# Patient Record
Sex: Male | Born: 1945 | Race: Black or African American | Hispanic: No | Marital: Married
Health system: Southern US, Community
[De-identification: ages and names within clinical notes are randomized; demographics above are authoritative.]

---

## 2001-07-19 ENCOUNTER — Encounter: Payer: Self-pay | Admitting: Pulmonary Disease

## 2001-07-19 ENCOUNTER — Ambulatory Visit (HOSPITAL_COMMUNITY): Admission: RE | Admit: 2001-07-19 | Discharge: 2001-07-19 | Payer: Self-pay | Admitting: Pulmonary Disease

## 2001-08-08 ENCOUNTER — Encounter: Admission: RE | Admit: 2001-08-08 | Discharge: 2001-11-06 | Payer: Self-pay | Admitting: Rheumatology

## 2002-04-20 ENCOUNTER — Encounter: Payer: Self-pay | Admitting: Pulmonary Disease

## 2002-12-29 ENCOUNTER — Encounter: Payer: Self-pay | Admitting: Gastroenterology

## 2003-12-26 ENCOUNTER — Encounter: Payer: Self-pay | Admitting: Internal Medicine

## 2004-01-28 ENCOUNTER — Ambulatory Visit: Payer: Self-pay | Admitting: Pulmonary Disease

## 2004-02-14 ENCOUNTER — Ambulatory Visit: Payer: Self-pay | Admitting: Internal Medicine

## 2004-02-22 ENCOUNTER — Ambulatory Visit (HOSPITAL_COMMUNITY): Admission: RE | Admit: 2004-02-22 | Discharge: 2004-02-22 | Payer: Self-pay | Admitting: Internal Medicine

## 2004-02-22 ENCOUNTER — Ambulatory Visit: Payer: Self-pay | Admitting: Internal Medicine

## 2004-04-29 ENCOUNTER — Ambulatory Visit: Payer: Self-pay | Admitting: Internal Medicine

## 2004-04-29 ENCOUNTER — Encounter: Payer: Self-pay | Admitting: Gastroenterology

## 2004-05-23 ENCOUNTER — Ambulatory Visit (HOSPITAL_COMMUNITY): Admission: RE | Admit: 2004-05-23 | Discharge: 2004-05-23 | Payer: Self-pay | Admitting: Internal Medicine

## 2004-05-23 ENCOUNTER — Encounter (INDEPENDENT_AMBULATORY_CARE_PROVIDER_SITE_OTHER): Payer: Self-pay | Admitting: *Deleted

## 2004-06-11 ENCOUNTER — Ambulatory Visit: Payer: Self-pay | Admitting: Internal Medicine

## 2004-09-16 ENCOUNTER — Ambulatory Visit: Payer: Self-pay | Admitting: Internal Medicine

## 2004-09-26 ENCOUNTER — Ambulatory Visit: Payer: Self-pay | Admitting: Internal Medicine

## 2004-12-22 ENCOUNTER — Ambulatory Visit: Payer: Self-pay | Admitting: Internal Medicine

## 2005-02-23 ENCOUNTER — Ambulatory Visit: Payer: Self-pay | Admitting: Internal Medicine

## 2005-07-10 ENCOUNTER — Ambulatory Visit: Payer: Self-pay | Admitting: Internal Medicine

## 2005-07-31 ENCOUNTER — Ambulatory Visit: Payer: Self-pay | Admitting: Pulmonary Disease

## 2005-08-30 ENCOUNTER — Encounter: Payer: Self-pay | Admitting: Pulmonary Disease

## 2005-08-30 ENCOUNTER — Ambulatory Visit (HOSPITAL_BASED_OUTPATIENT_CLINIC_OR_DEPARTMENT_OTHER): Admission: RE | Admit: 2005-08-30 | Discharge: 2005-08-30 | Payer: Self-pay | Admitting: Pulmonary Disease

## 2005-09-16 ENCOUNTER — Ambulatory Visit: Payer: Self-pay | Admitting: Pulmonary Disease

## 2005-10-02 ENCOUNTER — Ambulatory Visit: Payer: Self-pay | Admitting: Internal Medicine

## 2005-10-09 ENCOUNTER — Ambulatory Visit (HOSPITAL_COMMUNITY): Admission: RE | Admit: 2005-10-09 | Discharge: 2005-10-09 | Payer: Self-pay | Admitting: Internal Medicine

## 2005-10-23 ENCOUNTER — Ambulatory Visit: Payer: Self-pay | Admitting: Gastroenterology

## 2005-11-12 ENCOUNTER — Ambulatory Visit: Payer: Self-pay | Admitting: Gastroenterology

## 2005-11-18 ENCOUNTER — Encounter (INDEPENDENT_AMBULATORY_CARE_PROVIDER_SITE_OTHER): Payer: Self-pay | Admitting: Specialist

## 2005-11-18 ENCOUNTER — Ambulatory Visit: Payer: Self-pay | Admitting: Gastroenterology

## 2005-11-23 ENCOUNTER — Ambulatory Visit: Payer: Self-pay | Admitting: Pulmonary Disease

## 2006-01-01 ENCOUNTER — Ambulatory Visit: Payer: Self-pay | Admitting: Gastroenterology

## 2006-01-21 ENCOUNTER — Ambulatory Visit: Payer: Self-pay | Admitting: Internal Medicine

## 2006-03-05 ENCOUNTER — Emergency Department (HOSPITAL_COMMUNITY): Admission: EM | Admit: 2006-03-05 | Discharge: 2006-03-05 | Payer: Self-pay | Admitting: Emergency Medicine

## 2006-03-16 ENCOUNTER — Ambulatory Visit: Payer: Self-pay | Admitting: Internal Medicine

## 2006-05-04 ENCOUNTER — Ambulatory Visit: Payer: Self-pay | Admitting: Endocrinology

## 2006-05-18 ENCOUNTER — Ambulatory Visit: Payer: Self-pay | Admitting: Internal Medicine

## 2006-05-25 ENCOUNTER — Ambulatory Visit: Payer: Self-pay | Admitting: Gastroenterology

## 2006-07-05 ENCOUNTER — Ambulatory Visit: Payer: Self-pay | Admitting: Gastroenterology

## 2006-07-28 ENCOUNTER — Ambulatory Visit: Payer: Self-pay | Admitting: Internal Medicine

## 2006-08-30 ENCOUNTER — Ambulatory Visit: Payer: Self-pay | Admitting: Internal Medicine

## 2006-08-30 LAB — CONVERTED CEMR LAB
ALT: 54 units/L — ABNORMAL HIGH (ref 0–40)
Albumin: 4 g/dL (ref 3.5–5.2)
Alkaline Phosphatase: 40 units/L (ref 39–117)
BUN: 12 mg/dL (ref 6–23)
Basophils Absolute: 0.1 10*3/uL (ref 0.0–0.1)
Basophils Relative: 0.9 % (ref 0.0–1.0)
CO2: 32 meq/L (ref 19–32)
Calcium: 9.2 mg/dL (ref 8.4–10.5)
Eosinophils Absolute: 0.1 10*3/uL (ref 0.0–0.6)
GFR calc Af Amer: 88 mL/min
GFR calc non Af Amer: 73 mL/min
Lymphocytes Relative: 19.8 % (ref 12.0–46.0)
MCHC: 32.7 g/dL (ref 30.0–36.0)
Monocytes Relative: 8.1 % (ref 3.0–11.0)
Neutro Abs: 6.2 10*3/uL (ref 1.4–7.7)
Platelets: 250 10*3/uL (ref 150–400)
TSH: 1.82 microintl units/mL (ref 0.35–5.50)

## 2006-09-17 ENCOUNTER — Ambulatory Visit: Payer: Self-pay

## 2006-09-17 ENCOUNTER — Encounter: Payer: Self-pay | Admitting: Internal Medicine

## 2006-09-21 ENCOUNTER — Ambulatory Visit: Payer: Self-pay | Admitting: Internal Medicine

## 2006-09-25 DIAGNOSIS — J4489 Other specified chronic obstructive pulmonary disease: Secondary | ICD-10-CM | POA: Insufficient documentation

## 2006-09-25 DIAGNOSIS — J449 Chronic obstructive pulmonary disease, unspecified: Secondary | ICD-10-CM

## 2006-09-25 DIAGNOSIS — I10 Essential (primary) hypertension: Secondary | ICD-10-CM | POA: Insufficient documentation

## 2006-09-25 DIAGNOSIS — J309 Allergic rhinitis, unspecified: Secondary | ICD-10-CM | POA: Insufficient documentation

## 2006-10-14 ENCOUNTER — Ambulatory Visit: Payer: Self-pay | Admitting: Cardiology

## 2006-10-14 LAB — CONVERTED CEMR LAB
BUN: 12 mg/dL (ref 6–23)
Basophils Relative: 0.7 % (ref 0.0–1.0)
CO2: 30 meq/L (ref 19–32)
Chloride: 103 meq/L (ref 96–112)
Creatinine, Ser: 0.9 mg/dL (ref 0.4–1.5)
HCT: 40.3 % (ref 39.0–52.0)
Hemoglobin: 13.4 g/dL (ref 13.0–17.0)
INR: 1 (ref 0.9–2.0)
Monocytes Absolute: 0.5 10*3/uL (ref 0.2–0.7)
Neutrophils Relative %: 66.4 % (ref 43.0–77.0)
Potassium: 4 meq/L (ref 3.5–5.1)
Prothrombin Time: 12 s (ref 10.0–14.0)
RBC: 4.95 M/uL (ref 4.22–5.81)
RDW: 14.3 % (ref 11.5–14.6)
Sodium: 139 meq/L (ref 135–145)
aPTT: 31.8 s (ref 26.5–36.5)

## 2006-10-19 ENCOUNTER — Inpatient Hospital Stay (HOSPITAL_BASED_OUTPATIENT_CLINIC_OR_DEPARTMENT_OTHER): Admission: RE | Admit: 2006-10-19 | Discharge: 2006-10-19 | Payer: Self-pay | Admitting: Cardiovascular Disease

## 2006-10-19 ENCOUNTER — Ambulatory Visit: Payer: Self-pay | Admitting: Cardiovascular Disease

## 2006-10-25 ENCOUNTER — Ambulatory Visit: Payer: Self-pay

## 2006-10-28 ENCOUNTER — Ambulatory Visit: Payer: Self-pay | Admitting: Cardiology

## 2006-11-03 ENCOUNTER — Ambulatory Visit: Payer: Self-pay | Admitting: Pulmonary Disease

## 2006-11-16 ENCOUNTER — Ambulatory Visit: Payer: Self-pay | Admitting: Gastroenterology

## 2006-11-30 ENCOUNTER — Encounter: Payer: Self-pay | Admitting: Gastroenterology

## 2007-03-21 ENCOUNTER — Ambulatory Visit: Payer: Self-pay | Admitting: Pulmonary Disease

## 2007-03-21 DIAGNOSIS — J438 Other emphysema: Secondary | ICD-10-CM | POA: Insufficient documentation

## 2007-03-21 DIAGNOSIS — J45909 Unspecified asthma, uncomplicated: Secondary | ICD-10-CM | POA: Insufficient documentation

## 2007-03-22 ENCOUNTER — Ambulatory Visit: Payer: Self-pay | Admitting: Internal Medicine

## 2007-03-22 DIAGNOSIS — J45909 Unspecified asthma, uncomplicated: Secondary | ICD-10-CM | POA: Insufficient documentation

## 2007-03-22 DIAGNOSIS — K219 Gastro-esophageal reflux disease without esophagitis: Secondary | ICD-10-CM | POA: Insufficient documentation

## 2007-03-22 DIAGNOSIS — E785 Hyperlipidemia, unspecified: Secondary | ICD-10-CM

## 2007-03-22 DIAGNOSIS — K279 Peptic ulcer, site unspecified, unspecified as acute or chronic, without hemorrhage or perforation: Secondary | ICD-10-CM | POA: Insufficient documentation

## 2007-03-22 DIAGNOSIS — M545 Low back pain: Secondary | ICD-10-CM

## 2007-03-22 DIAGNOSIS — R51 Headache: Secondary | ICD-10-CM

## 2007-03-22 DIAGNOSIS — R21 Rash and other nonspecific skin eruption: Secondary | ICD-10-CM | POA: Insufficient documentation

## 2007-03-22 DIAGNOSIS — Z860100 Personal history of colon polyps, unspecified: Secondary | ICD-10-CM | POA: Insufficient documentation

## 2007-03-22 DIAGNOSIS — G4733 Obstructive sleep apnea (adult) (pediatric): Secondary | ICD-10-CM

## 2007-03-22 DIAGNOSIS — R519 Headache, unspecified: Secondary | ICD-10-CM | POA: Insufficient documentation

## 2007-03-23 ENCOUNTER — Encounter (INDEPENDENT_AMBULATORY_CARE_PROVIDER_SITE_OTHER): Payer: Self-pay | Admitting: *Deleted

## 2007-03-26 ENCOUNTER — Encounter: Admission: RE | Admit: 2007-03-26 | Discharge: 2007-03-26 | Payer: Self-pay | Admitting: Internal Medicine

## 2007-03-26 ENCOUNTER — Telehealth (INDEPENDENT_AMBULATORY_CARE_PROVIDER_SITE_OTHER): Payer: Self-pay | Admitting: *Deleted

## 2007-03-28 ENCOUNTER — Encounter: Payer: Self-pay | Admitting: Internal Medicine

## 2007-03-28 DIAGNOSIS — Z8679 Personal history of other diseases of the circulatory system: Secondary | ICD-10-CM | POA: Insufficient documentation

## 2007-04-12 ENCOUNTER — Telehealth: Payer: Self-pay | Admitting: Internal Medicine

## 2007-06-03 ENCOUNTER — Telehealth (INDEPENDENT_AMBULATORY_CARE_PROVIDER_SITE_OTHER): Payer: Self-pay | Admitting: *Deleted

## 2007-06-24 ENCOUNTER — Ambulatory Visit: Payer: Self-pay | Admitting: Internal Medicine

## 2007-06-24 DIAGNOSIS — R079 Chest pain, unspecified: Secondary | ICD-10-CM

## 2007-07-04 ENCOUNTER — Encounter: Payer: Self-pay | Admitting: Internal Medicine

## 2007-07-18 ENCOUNTER — Ambulatory Visit: Payer: Self-pay | Admitting: Pulmonary Disease

## 2007-07-26 ENCOUNTER — Ambulatory Visit: Payer: Self-pay | Admitting: Internal Medicine

## 2007-07-26 DIAGNOSIS — M1710 Unilateral primary osteoarthritis, unspecified knee: Secondary | ICD-10-CM

## 2007-07-26 DIAGNOSIS — IMO0002 Reserved for concepts with insufficient information to code with codable children: Secondary | ICD-10-CM | POA: Insufficient documentation

## 2007-09-22 ENCOUNTER — Encounter: Payer: Self-pay | Admitting: Internal Medicine

## 2007-09-27 ENCOUNTER — Ambulatory Visit: Payer: Self-pay | Admitting: Internal Medicine

## 2007-09-27 DIAGNOSIS — R82998 Other abnormal findings in urine: Secondary | ICD-10-CM

## 2007-09-27 DIAGNOSIS — R109 Unspecified abdominal pain: Secondary | ICD-10-CM | POA: Insufficient documentation

## 2007-09-27 LAB — CONVERTED CEMR LAB
ALT: 53 units/L (ref 0–53)
AST: 35 units/L (ref 0–37)
Basophils Absolute: 0.1 10*3/uL (ref 0.0–0.1)
Bilirubin, Direct: 0.2 mg/dL (ref 0.0–0.3)
CO2: 31 meq/L (ref 19–32)
Chloride: 100 meq/L (ref 96–112)
Cholesterol: 168 mg/dL (ref 0–200)
Creatinine, Ser: 1 mg/dL (ref 0.4–1.5)
Direct LDL: 95.9 mg/dL
Eosinophils Absolute: 0.2 10*3/uL (ref 0.0–0.7)
GFR calc non Af Amer: 80 mL/min
HDL: 45.5 mg/dL (ref >39.0)
Hemoglobin, Urine: NEGATIVE
Ketones, ur: NEGATIVE mg/dL
Leukocytes, UA: NEGATIVE
Lymphocytes Relative: 21.6 % (ref 12.0–46.0)
MCHC: 33.8 g/dL (ref 30.0–36.0)
MCV: 80 fL (ref 78.0–100.0)
Neutrophils Relative %: 65.3 % (ref 43.0–77.0)
Platelets: 245 10*3/uL (ref 150–400)
Potassium: 3.6 meq/L (ref 3.5–5.1)
RBC: 5.04 M/uL (ref 4.22–5.81)
RDW: 14.6 % (ref 11.5–14.6)
Sodium: 138 meq/L (ref 135–145)
Specific Gravity, Urine: 1.025 (ref 1.000–1.03)
TSH: 2.01 microintl units/mL (ref 0.35–5.50)
Total Bilirubin: 1.4 mg/dL — ABNORMAL HIGH (ref 0.3–1.2)
Total CHOL/HDL Ratio: 3.7
Urine Glucose: NEGATIVE mg/dL
Urobilinogen, UA: 1 (ref 0.0–1.0)
VLDL: 48 mg/dL — ABNORMAL HIGH (ref 0–40)

## 2007-09-29 ENCOUNTER — Encounter: Admission: RE | Admit: 2007-09-29 | Discharge: 2007-09-29 | Payer: Self-pay | Admitting: Internal Medicine

## 2007-10-27 ENCOUNTER — Ambulatory Visit: Payer: Self-pay | Admitting: Gastroenterology

## 2007-10-27 DIAGNOSIS — B182 Chronic viral hepatitis C: Secondary | ICD-10-CM | POA: Insufficient documentation

## 2007-10-27 DIAGNOSIS — R1033 Periumbilical pain: Secondary | ICD-10-CM | POA: Insufficient documentation

## 2007-11-10 ENCOUNTER — Ambulatory Visit: Payer: Self-pay | Admitting: Internal Medicine

## 2007-11-11 ENCOUNTER — Telehealth: Payer: Self-pay | Admitting: Internal Medicine

## 2007-11-21 ENCOUNTER — Ambulatory Visit: Payer: Self-pay | Admitting: Pulmonary Disease

## 2007-12-05 ENCOUNTER — Ambulatory Visit: Payer: Self-pay | Admitting: Internal Medicine

## 2008-02-16 ENCOUNTER — Encounter: Payer: Self-pay | Admitting: Internal Medicine

## 2008-03-06 ENCOUNTER — Ambulatory Visit: Payer: Self-pay | Admitting: Internal Medicine

## 2008-03-13 ENCOUNTER — Ambulatory Visit: Payer: Self-pay | Admitting: Pulmonary Disease

## 2008-04-11 ENCOUNTER — Ambulatory Visit: Payer: Self-pay | Admitting: Pulmonary Disease

## 2008-05-07 ENCOUNTER — Ambulatory Visit: Payer: Self-pay | Admitting: Internal Medicine

## 2008-05-07 ENCOUNTER — Encounter: Payer: Self-pay | Admitting: Pulmonary Disease

## 2008-06-07 ENCOUNTER — Encounter: Payer: Self-pay | Admitting: Internal Medicine

## 2008-07-26 ENCOUNTER — Telehealth (INDEPENDENT_AMBULATORY_CARE_PROVIDER_SITE_OTHER): Payer: Self-pay | Admitting: *Deleted

## 2008-07-27 ENCOUNTER — Telehealth (INDEPENDENT_AMBULATORY_CARE_PROVIDER_SITE_OTHER): Payer: Self-pay | Admitting: *Deleted

## 2008-08-01 ENCOUNTER — Telehealth (INDEPENDENT_AMBULATORY_CARE_PROVIDER_SITE_OTHER): Payer: Self-pay | Admitting: *Deleted

## 2008-08-02 ENCOUNTER — Telehealth: Payer: Self-pay | Admitting: Internal Medicine

## 2008-08-30 ENCOUNTER — Encounter: Payer: Self-pay | Admitting: Internal Medicine

## 2008-10-05 ENCOUNTER — Ambulatory Visit: Payer: Self-pay | Admitting: Internal Medicine

## 2008-10-05 LAB — CONVERTED CEMR LAB
Basophils Absolute: 0.1 10*3/uL (ref 0.0–0.1)
CO2: 30 meq/L (ref 19–32)
Calcium: 9.2 mg/dL (ref 8.4–10.5)
GFR calc non Af Amer: 78.63 mL/min (ref >60)
HCT: 41.8 % (ref 39.0–52.0)
HDL: 50.3 mg/dL (ref >39.00)
Leukocytes, UA: NEGATIVE
Lymphs Abs: 1.7 10*3/uL (ref 0.7–4.0)
MCV: 80.9 fL (ref 78.0–100.0)
Monocytes Absolute: 0.6 10*3/uL (ref 0.1–1.0)
Neutrophils Relative %: 61.7 % (ref 43.0–77.0)
Nitrite: NEGATIVE
PSA: 0.53 ng/mL (ref 0.10–4.00)
Platelets: 210 10*3/uL (ref 150.0–400.0)
Potassium: 4.1 meq/L (ref 3.5–5.1)
RDW: 13.9 % (ref 11.5–14.6)
Sodium: 140 meq/L (ref 135–145)
Specific Gravity, Urine: 1.025 (ref 1.000–1.030)
TSH: 1.74 microintl units/mL (ref 0.35–5.50)
Total Bilirubin: 1.5 mg/dL — ABNORMAL HIGH (ref 0.3–1.2)
Total CHOL/HDL Ratio: 3
Triglycerides: 63 mg/dL (ref 0.0–149.0)
VLDL: 12.6 mg/dL (ref 0.0–40.0)
pH: 6 (ref 5.0–8.0)

## 2008-10-08 ENCOUNTER — Ambulatory Visit: Payer: Self-pay | Admitting: Pulmonary Disease

## 2008-10-15 ENCOUNTER — Ambulatory Visit: Payer: Self-pay | Admitting: Internal Medicine

## 2008-10-15 DIAGNOSIS — IMO0002 Reserved for concepts with insufficient information to code with codable children: Secondary | ICD-10-CM | POA: Insufficient documentation

## 2008-10-26 ENCOUNTER — Encounter: Admission: RE | Admit: 2008-10-26 | Discharge: 2008-10-26 | Payer: Self-pay | Admitting: Internal Medicine

## 2008-11-13 ENCOUNTER — Telehealth: Payer: Self-pay | Admitting: Internal Medicine

## 2009-01-22 ENCOUNTER — Encounter: Payer: Self-pay | Admitting: Internal Medicine

## 2009-03-12 ENCOUNTER — Ambulatory Visit: Payer: Self-pay | Admitting: Internal Medicine

## 2009-03-12 DIAGNOSIS — R1319 Other dysphagia: Secondary | ICD-10-CM

## 2009-03-20 ENCOUNTER — Encounter (INDEPENDENT_AMBULATORY_CARE_PROVIDER_SITE_OTHER): Payer: Self-pay | Admitting: *Deleted

## 2009-03-20 ENCOUNTER — Ambulatory Visit: Payer: Self-pay | Admitting: Gastroenterology

## 2009-03-27 ENCOUNTER — Ambulatory Visit: Payer: Self-pay | Admitting: Gastroenterology

## 2009-04-05 ENCOUNTER — Telehealth: Payer: Self-pay | Admitting: Internal Medicine

## 2009-04-05 ENCOUNTER — Telehealth: Payer: Self-pay | Admitting: Gastroenterology

## 2009-04-09 ENCOUNTER — Telehealth (INDEPENDENT_AMBULATORY_CARE_PROVIDER_SITE_OTHER): Payer: Self-pay | Admitting: *Deleted

## 2009-05-29 ENCOUNTER — Telehealth: Payer: Self-pay | Admitting: Gastroenterology

## 2009-05-29 ENCOUNTER — Telehealth: Payer: Self-pay | Admitting: Internal Medicine

## 2009-06-27 ENCOUNTER — Ambulatory Visit: Payer: Self-pay | Admitting: Internal Medicine

## 2009-07-02 ENCOUNTER — Telehealth (INDEPENDENT_AMBULATORY_CARE_PROVIDER_SITE_OTHER): Payer: Self-pay

## 2009-07-03 ENCOUNTER — Encounter (HOSPITAL_COMMUNITY): Admission: RE | Admit: 2009-07-03 | Discharge: 2009-09-11 | Payer: Self-pay | Admitting: Internal Medicine

## 2009-07-03 ENCOUNTER — Ambulatory Visit: Payer: Self-pay | Admitting: Cardiovascular Disease

## 2009-07-03 ENCOUNTER — Ambulatory Visit: Payer: Self-pay

## 2009-08-02 ENCOUNTER — Telehealth: Payer: Self-pay | Admitting: Internal Medicine

## 2009-08-28 ENCOUNTER — Encounter: Payer: Self-pay | Admitting: Internal Medicine

## 2009-09-25 ENCOUNTER — Ambulatory Visit: Payer: Self-pay | Admitting: Internal Medicine

## 2009-09-26 LAB — CONVERTED CEMR LAB
ALT: 51 units/L (ref 0–53)
AST: 36 units/L (ref 0–37)
BUN: 20 mg/dL (ref 6–23)
Bilirubin Urine: NEGATIVE
Bilirubin, Direct: 0.2 mg/dL (ref 0.0–0.3)
Eosinophils Relative: 1.7 % (ref 0.0–5.0)
GFR calc non Af Amer: 73.42 mL/min (ref >60)
HCT: 40.8 % (ref 39.0–52.0)
HDL: 47.1 mg/dL (ref >39.00)
Hemoglobin, Urine: NEGATIVE
Hemoglobin: 13.4 g/dL (ref 13.0–17.0)
Leukocytes, UA: NEGATIVE
Lymphs Abs: 1.6 10*3/uL (ref 0.7–4.0)
Monocytes Relative: 9.4 % (ref 3.0–12.0)
Nitrite: NEGATIVE
Platelets: 235 10*3/uL (ref 150.0–400.0)
Potassium: 4 meq/L (ref 3.5–5.1)
Sodium: 141 meq/L (ref 135–145)
TSH: 1.41 microintl units/mL (ref 0.35–5.50)
Total Bilirubin: 1.2 mg/dL (ref 0.3–1.2)
Total CHOL/HDL Ratio: 4
Urobilinogen, UA: 0.2 (ref 0.0–1.0)
VLDL: 29.4 mg/dL (ref 0.0–40.0)
WBC: 8.5 10*3/uL (ref 4.5–10.5)

## 2009-10-16 ENCOUNTER — Encounter (INDEPENDENT_AMBULATORY_CARE_PROVIDER_SITE_OTHER): Payer: Self-pay | Admitting: *Deleted

## 2009-10-16 ENCOUNTER — Ambulatory Visit: Payer: Self-pay | Admitting: Internal Medicine

## 2009-10-16 ENCOUNTER — Encounter: Payer: Self-pay | Admitting: Internal Medicine

## 2010-04-08 NOTE — Assessment & Plan Note (Signed)
Summary: BACK PROBLEM / SWALLOWING PROBLEM/NWS   Vital Signs:  Patient profile:   65 year old male Height:      73 inches Weight:      234 pounds BMI:     30.98 O2 Sat:      99 % on Room air Temp:     97.5 degrees F oral Pulse rate:   94 / minute BP sitting:   118 / 70  (left arm) Cuff size:   regular  Vitals Entered ByZella Ball Ewing (March 12, 2009 1:36 PM)  O2 Flow:  Room air  CC: back pain/swallowing difficulty/RE   Primary Care Provider:  Oliver Barre, MD  CC:  back pain/swallowing difficulty/RE.  History of Present Illness: sees dr Orlan Leavens - due for bilat knee replacements relatively soon as he has come near the end of conservative tx;  today c/o  pain to the lower back and seems different from the knees and not better with increased the gabapentin; o/w for pain still on the tramadol as needed but now working as well as had in the past;  lower bakc pain about the same he states for several years - used to see dr nudelman/NS  - last seen approx 3 yrs ago;  has appt at [facility] soon (first seen approx 2 mo ago) for the lower back - took his [place]  MRI's but [facility] want to repeat the studies;  no change in the blowels or bladder; no fever, wt loss, night swetats, falls, injury or increased other LE pain, weakness or numbness  although he states he is not sure b/c has so much knee pain bilat as well.    also incidently wit acute onset ST, ? low grade temp, minimal prod cough, malaise and general wekaness for 1 wk ';  with some pain with coughing to the mid upper chest;  also has pain to the mid chest sternal area but only with swallowing - occurs daily, n/v , wt loss, blood, or other abd pain.  Overall has good compliance with meds, including the PPI.   Pt denies other CP, sob, doe, wheezing, orthopnea, pnd, worsening LE edema, palps, dizziness or syncope   Pt denies new neuro symptoms such as headache, facial or extremity weakness   Problems Prior to Update: 1)  Other Dysphagia   (ICD-787.29) 2)  Bronchitis-acute  (ICD-466.0) 3)  Lumbar Radiculopathy, Left  (ICD-724.4) 4)  Abdominal Pain-periumbilical  (ICD-789.05) 5)  Hepatitis C-chronic Without Coma  (ICD-070.54) 6)  Other Nonspecific Finding Examination of Urine  (ICD-791.9) 7)  Abdominal Pain, Unspecified Site  (ICD-789.00) 8)  Preventive Health Care  (ICD-V70.0) 9)  Osteoarthritis, Knees, Bilateral  (ICD-715.96) 10)  Chest Pain, Unspecified  (ICD-786.50) 11)  Cerebrovascular Accident, Hx of  (ICD-V12.50) 12)  Peptic Ulcer Disease  (ICD-533.90) 13)  Hyperlipidemia  (ICD-272.4) 14)  Colonic Polyps, Hx of  (ICD-V12.72) 15)  Gerd  (ICD-530.81) 16)  Low Back Pain  (ICD-724.2) 17)  Asthma  (ICD-493.90) 18)  Headache  (ICD-784.0) 19)  Rash-nonvesicular  (ICD-782.1) 20)  Obstructive Sleep Apnea  (ICD-327.23) 21)  Emphysema  (ICD-492.8) 22)  Intrinsic Asthma, Unspecified  (ICD-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 Hold 1 Tablet By Mouth Once A Day Hold 2)  Cialis 20 Mg Tabs (Tadalafil) .... Take 1 Tablet By Mouth Once A Day 3)  B  Complex 100   Tabs (B Complex Vitamins) .... Take 1 Tablet By Mouth Once A Day 4)  Omeprazole 20 Mg Cpdr (Omeprazole) .... 2 By Mouth Once Daily 5)  Flexeril 10 Mg  Tabs (Cyclobenzaprine Hcl) .... Take 1 To 2 Tabs By Mouth At Bedtime As Needed 6)  Ambien 10 Mg  Tabs (Zolpidem Tartrate) .... Take 1 Tab By Mouth At Bedtime 7)  Tramadol Hcl 50 Mg  Tabs (Tramadol Hcl) .Marland Kitchen.. 1 - 2 By Mouth Q 6 Hrs Prn 8)  Clobetasol Propionate 0.05 %  Crea (Clobetasol Propionate) .... Use As Directed 1 Once Daily As Needed 9)  Meloxicam 15 Mg Tabs (Meloxicam) .Marland Kitchen.. 1po Once Daily As Needed 10)  Hyzaar 100-25 Mg Tabs (Losartan Potassium-Hctz) .Marland Kitchen.. 1 By Mouth Once Daily 11)  Proair Hfa 108 (90 Base) Mcg/act  Aers (Albuterol Sulfate) .... 2 Puffs Every 4-6 Hours As  Needed 12)  Gabapentin 300 Mg Caps (Gabapentin) .... 2po Three Times A Day  Current Medications (verified): 1)  Cetirizine Hcl 10 Mg Tabs (Cetirizine Hcl) .... Hold 1 Tablet By Mouth Once A Day Hold 2)  Cialis 20 Mg Tabs (Tadalafil) .... Take 1 Tablet By Mouth Once A Day 3)  B Complex 100   Tabs (B Complex Vitamins) .... Take 1 Tablet By Mouth Once A Day 4)  Omeprazole 20 Mg Cpdr (Omeprazole) .... 2 By Mouth Once Daily 5)  Flexeril 10 Mg  Tabs (Cyclobenzaprine Hcl) .... Take 1 To 2 Tabs By Mouth At Bedtime As Needed 6)  Ambien 10 Mg  Tabs (Zolpidem Tartrate) .... Take 1 Tab By Mouth At Bedtime 7)  Hydrocodone-Acetaminophen 5-325 Mg Tabs (Hydrocodone-Acetaminophen) .Marland Kitchen.. 1po Q 6 Hrs As Needed Pain 8)  Clobetasol Propionate 0.05 %  Crea (Clobetasol Propionate) .... Use As Directed 1 Once Daily As Needed 9)  Meloxicam 15 Mg Tabs (Meloxicam) .Marland Kitchen.. 1po Once Daily As Needed 10)  Hyzaar 100-25 Mg Tabs (Losartan Potassium-Hctz) .Marland Kitchen.. 1 By Mouth Once Daily 11)  Proair Hfa 108 (90 Base) Mcg/act  Aers (Albuterol Sulfate) .... 2 Puffs Every 4-6 Hours As Needed 12)  Gabapentin 300 Mg Caps (Gabapentin) .... 2po Three Times A Day 13)  Azithromycin 250 Mg Tabs (Azithromycin) .... 2po Qd For 1 Day, Then 1po Qd For 4days, Then Stop 14)  Tessalon Perles 100 Mg Caps (Benzonatate) .Marland Kitchen.. 1 - 2 By Mouth Three Times A Day As Needed  Allergies (verified): 1)  ! Darvocet 2)  ! * Latex  Past History:  Past Surgical History: Last updated: 10/27/2007 s/p umbilical hernia repair  Social History: Last updated: 10/27/2007 Former Smoker-stopped 3 years ago Married 1 child disabled Alcohol use-yes-1-2 drinks weekly Illicit Drug Use - no Patient gets regular exercise.  Risk Factors: Alcohol Use: <1 (10/08/2008) Exercise: yes (10/27/2007)  Risk Factors: Smoking Status: quit (10/08/2008) Packs/Day: 1/2 pks (10/08/2008)  Past Medical History: Allergic rhinitis COPD Hypertension OSA -  mild Asthma E.D. normal cors on cath 8/08 right knee djd Low back pain GERD Colonic polyps, hx of - adenoma 9/07 hepatitis C Hyperlipidemia Peptic ulcer disease ? RA - truslow hx of alcohol dependency Sleep Apnea  Review of Systems       all otherwise negative per pt -  Physical Exam  General:  alert and overweight-appearing.  , mild ill  Head:  normocephalic and atraumatic.   Eyes:  vision grossly intact, pupils equal, and pupils round.   Ears:  bilat tm's red, sinus nontender Nose:  nasal dischargemucosal pallor and mucosal erythema.  Mouth:  pharyngeal erythema and fair dentition.   Neck:  supple and no masses.   Lungs:  normal respiratory effort and normal breath sounds.   Heart:  normal rate and regular rhythm.   Abdomen:  soft, non-tender, and normal bowel sounds.   Msk:  no joint tenderness and no joint swelling.  ; spine nontender; bilat knees wtih marked crepitus  Extremities:  no edema, no erythema  Neurologic:  cranial nerves II-XII intact and strength normal in all extremities.     Impression & Recommendations:  Problem # 1:  BRONCHITIS-ACUTE (ICD-466.0)  His updated medication list for this problem includes:    Proair Hfa 108 (90 Base) Mcg/act Aers (Albuterol sulfate) .Marland Kitchen... 2 puffs every 4-6 hours as needed    Azithromycin 250 Mg Tabs (Azithromycin) .Marland Kitchen... 2po qd for 1 day, then 1po qd for 4days, then stop    Tessalon Perles 100 Mg Caps (Benzonatate) .Marland Kitchen... 1 - 2 by mouth three times a day as needed treat as above, f/u any worsening signs or symptoms , check  cxr - r/o pna  Orders: T-2 View CXR, Same Day (71020.5TC)  Problem # 2:  OTHER DYSPHAGIA (ICD-787.29)  with discomfort with swallowing, and more with liquids than solids  - Continue all previous medications as before this visit , refer GI  Orders: Gastroenterology Referral (GI)  Problem # 3:  LUMBAR RADICULOPATHY, LEFT (ICD-724.4)  His updated medication list for this problem includes:     Flexeril 10 Mg Tabs (Cyclobenzaprine hcl) .Marland Kitchen... Take 1 to 2 tabs by mouth at bedtime as needed    Hydrocodone-acetaminophen 5-325 Mg Tabs (Hydrocodone-acetaminophen) .Marland Kitchen... 1po q 6 hrs as needed pain    Meloxicam 15 Mg Tabs (Meloxicam) .Marland Kitchen... 1po once daily as needed and chronic back pain - treat as above, f/u any worsening signs or symptoms, f/u NS at [facility]  Problem # 4:  OSTEOARTHRITIS, KNEES, BILATERAL (ICD-715.96)  His updated medication list for this problem includes:    Flexeril 10 Mg Tabs (Cyclobenzaprine hcl) .Marland Kitchen... Take 1 to 2 tabs by mouth at bedtime as needed    Hydrocodone-acetaminophen 5-325 Mg Tabs (Hydrocodone-acetaminophen) .Marland Kitchen... 1po q 6 hrs as needed pain    Meloxicam 15 Mg Tabs (Meloxicam) .Marland Kitchen... 1po once daily as needed for surgury relatively soon per pt  per ortho - treat as above, f/u any worsening signs or symptoms   Complete Medication List: 1)  Cetirizine Hcl 10 Mg Tabs (Cetirizine hcl) .... Hold 1 tablet by mouth once a day hold 2)  Cialis 20 Mg Tabs (Tadalafil) .... Take 1 tablet by mouth once a day 3)  B Complex 100 Tabs (B complex vitamins) .... Take 1 tablet by mouth once a day 4)  Omeprazole 20 Mg Cpdr (Omeprazole) .... 2 by mouth once daily 5)  Flexeril 10 Mg Tabs (Cyclobenzaprine hcl) .... Take 1 to 2 tabs by mouth at bedtime as needed 6)  Ambien 10 Mg Tabs (Zolpidem tartrate) .... Take 1 tab by mouth at bedtime 7)  Hydrocodone-acetaminophen 5-325 Mg Tabs (Hydrocodone-acetaminophen) .Marland Kitchen.. 1po q 6 hrs as needed pain 8)  Clobetasol Propionate 0.05 % Crea (Clobetasol propionate) .... Use as directed 1 once daily as needed 9)  Meloxicam 15 Mg Tabs (Meloxicam) .Marland Kitchen.. 1po once daily as needed 10)  Hyzaar 100-25 Mg Tabs (Losartan potassium-hctz) .Marland Kitchen.. 1 by mouth once daily 11)  Proair Hfa 108 (90 Base) Mcg/act Aers (Albuterol sulfate) .... 2 puffs every 4-6 hours as needed 12)  Gabapentin 300 Mg Caps (Gabapentin) .Marland KitchenMarland KitchenMarland Kitchen  2po three times a day 13)  Azithromycin 250 Mg Tabs  (Azithromycin) .... 2po qd for 1 day, then 1po qd for 4days, then stop 14)  Tessalon Perles 100 Mg Caps (Benzonatate) .Marland Kitchen.. 1 - 2 by mouth three times a day as needed  Patient Instructions: 1)  stop the tramadol 2)  take the hydrocodone as needed for pain 3)  Please take all new medications as prescribed - the antibx, cough medication 4)  Continue all previous medications as before this visit  5)  please keep your followup appts with Neurosurgury and Orthopedic 6)  You will be contacted about the referral(s) to: GI - Dr Russella Dar 7)  Please schedule a follow-up appointment in 6 months with CPX labs Prescriptions: HYDROCODONE-ACETAMINOPHEN 5-325 MG TABS (HYDROCODONE-ACETAMINOPHEN) 1po q 6 hrs as needed pain  #60 x 2   Entered and Authorized by:   Corwin Levins MD   Signed by:   Corwin Levins MD on 03/12/2009   Method used:   Print then Give to Patient   RxID:   [id number] TESSALON PERLES 100 MG CAPS (BENZONATATE) 1 - 2 by mouth three times a day as needed  #60 x 0   Entered and Authorized by:   Corwin Levins MD   Signed by:   Corwin Levins MD on 03/12/2009   Method used:   Print then Give to Patient   RxID:   [id number] AZITHROMYCIN 250 MG TABS (AZITHROMYCIN) 2po qd for 1 day, then 1po qd for 4days, then stop  #6 x 1   Entered and Authorized by:   Corwin Levins MD   Signed by:   Corwin Levins MD on 03/12/2009   Method used:   Print then Give to Patient   RxID:   [id number]

## 2010-04-08 NOTE — Progress Notes (Signed)
Summary: Request for completion of Disability Form from [employer]  [place] for completion of Disability Form from [facility]. Request forwarded to [employer]. Douglas Garrison  April 09, 2009 3:47 PM

## 2010-04-08 NOTE — Assessment & Plan Note (Signed)
Summary: Cardiology Nuclear Study  Nuclear Med Background Indications for Stress Test: Evaluation for Ischemia  Indications Comments: Recent ED: CP  History: Asthma, COPD, Emphysema, Heart Catheterization, Myocardial Perfusion Study  History Comments: 7/08 MPS: ? very mild inferior ischemia (low risk), EF=58%.>8/08 Cath: Normal Coronaries.  Symptoms: Chest Pain, Palpitations    Nuclear Pre-Procedure Cardiac Risk Factors: CVA, History of Smoking, Hypertension, Lipids, RBBB Caffeine/Decaff Intake: None NPO After: 9:00 PM Lungs: clear IV 0.9% NS with Angio Cath: 20g     IV Site: (R) Wrist IV Started by: Irean Hong RN Chest Size (in) 48     Height (in): 74 Weight (lb): 230 BMI: 29.64  Nuclear Med Study 1 or 2 day study:  1 day     Stress Test Type:  Stress Reading MD:  Charlton Haws, MD     Referring MD:  Oliver Barre Resting Radionuclide:  Technetium 64m Tetrofosmin     Resting Radionuclide Dose:  11 mCi  Stress Radionuclide:  Technetium 40m Tetrofosmin     Stress Radionuclide Dose:  33 mCi   Stress Protocol Exercise Time (min):  7:30 min     Max HR:  144 bpm     Predicted Max HR:  157 bpm  Max Systolic BP: 169 mm Hg     Percent Max HR:  91.72 %     METS: 9.3 Rate Pressure Product:  16109    Stress Test Technologist:  Milana Na EMT-P     Nuclear Technologist:  Domenic Polite CNMT  Rest Procedure  Myocardial perfusion imaging was performed at rest 45 minutes following the intravenous administration of Myoview Technetium 66m Tetrofosmin.  Stress Procedure  The patient exercised for 7:30. The patient stopped due to fatigue and denied any chest pain.  There were no significant ST-T wave changes, occ pvcs and rare pacs.  Myoview was injected at peak exercise and myocardial perfusion imaging was performed after a brief delay.  QPS Raw Data Images:  Normal; no motion artifact; normal heart/lung ratio. Stress Images:  NI: Uniform and normal uptake of tracer in all  myocardial segments. Rest Images:  Normal homogeneous uptake in all areas of the myocardium. Subtraction (SDS):  Normal Transient Ischemic Dilatation:  .94  (Normal <1.22)  Lung/Heart Ratio:  .27  (Normal <0.45)  Quantitative Gated Spect Images QGS EDV:  116 ml QGS ESV:  51 ml QGS EF:  56 % QGS cine images:  normal  Findings Normal nuclear study      Overall Impression  Exercise Capacity: Fair exercise capacity. BP Response: Normal blood pressure response. Clinical Symptoms: No chest pain ECG Impression: RBBB Overall Impression: Diaphragmatic attenuation no ishemia or infarct  Appended Document: Cardiology Nuclear Study LMOPT - labs negative, normal, or stable  - No Acute problem

## 2010-04-08 NOTE — Assessment & Plan Note (Signed)
Summary: DYSPHAGIA--CH.   History of Present Illness Visit Type: consult  Primary GI MD: Elie Goody MD [facility] Primary Provider: Oliver Barre, MD Requesting Provider: Oliver Barre, MD Chief Complaint: Chest pain when pt swallows food History of Present Illness:   This is a 65 year old male with a history of chest pain and difficulty swallowing liquids. He states that when he drinks a large amount of liquids he will notice a pain in his upper chest associated with difficulty swallowing. The symptoms usually subside within 1-2 minutes. He notes no difficulty with solid foods. He has frequent reflux symptoms occurring 3 or 4 times a week despite taking omeprazole 40 mg daily.   GI Review of Systems    Reports acid reflux and  chest pain.      Denies abdominal pain, belching, bloating, dysphagia with liquids, dysphagia with solids, heartburn, loss of appetite, nausea, vomiting, vomiting blood, weight loss, and  weight gain.        Denies anal fissure, black tarry stools, change in bowel habit, constipation, diarrhea, diverticulosis, fecal incontinence, heme positive stool, hemorrhoids, irritable bowel syndrome, jaundice, light color stool, liver problems, rectal bleeding, and  rectal pain.   Current Medications (verified): 1)  Cetirizine Hcl 10 Mg Tabs (Cetirizine Hcl) .... Hold 1 Tablet By Mouth Once A Day Hold 2)  Cialis 20 Mg Tabs (Tadalafil) .... Take 1 Tablet By Mouth Once A Day 3)  B Complex 100   Tabs (B Complex Vitamins) .... Take 1 Tablet By Mouth Once A Day 4)  Omeprazole 20 Mg Cpdr (Omeprazole) .... 2 By Mouth Once Daily 5)  Flexeril 10 Mg  Tabs (Cyclobenzaprine Hcl) .... Take 1 To 2 Tabs By Mouth At Bedtime As Needed 6)  Ambien 10 Mg  Tabs (Zolpidem Tartrate) .... Take 1 Tab By Mouth At Bedtime 7)  Hydrocodone-Acetaminophen 5-325 Mg Tabs (Hydrocodone-Acetaminophen) .Marland Kitchen.. 1po Q 6 Hrs As Needed Pain 8)  Clobetasol Propionate 0.05 %  Crea (Clobetasol Propionate) .... Use As Directed 1  Once Daily As Needed 9)  Meloxicam 15 Mg Tabs (Meloxicam) .Marland Kitchen.. 1po Once Daily As Needed 10)  Hyzaar 100-25 Mg Tabs (Losartan Potassium-Hctz) .Marland Kitchen.. 1 By Mouth Once Daily 11)  Azithromycin 250 Mg Tabs (Azithromycin) .... 2po Qd For 1 Day, Then 1po Qd For 4days, Then Stop 12)  Tessalon Perles 100 Mg Caps (Benzonatate) .Marland Kitchen.. 1 - 2 By Mouth Three Times A Day As Needed  Allergies (verified): 1)  ! Darvocet 2)  ! * Latex  Past History:  Past Medical History: Allergic rhinitis COPD Hypertension OSA - mild Asthma E.D. normal cors on cath 8/08 right knee djd Low back pain GERD Colonic polyps, hx of - adenoma 11/2000 hepatitis C Hyperlipidemia Peptic ulcer disease ? RA - truslow hx of alcohol dependency Sleep Apnea  Past Surgical History: umbilical hernia repair  Family History: Reviewed history from 03/22/2007 and no changes required. adopted daughter with DM  Social History: Reviewed history from 10/27/2007 and no changes required. Former Smoker-stopped 3 years ago Married 1 child disabled Alcohol use-yes-1-2 drinks weekly Illicit Drug Use - no Patient gets regular exercise.  Review of Systems       The pertinent positives and negatives are noted as above and in the HPI. All other ROS were reviewed and were negative.   Vital Signs:  Patient profile:   65 year old male Height:      73 inches Weight:      237 pounds BMI:     31.38 BSA:  2.31 Pulse rate:   94 / minute Pulse rhythm:   regular BP sitting:   122 / 74  (left arm) Cuff size:   regular  Vitals Entered By: Ok Anis CMA (March 20, 2009 1:34 PM)  Physical Exam  General:  Well developed, well nourished, no acute distress. Head:  Normocephalic and atraumatic. Eyes:  PERRLA, no icterus. Ears:  Normal auditory acuity. Mouth:  No deformity or lesions, dentition normal. Neck:  Supple; no masses or thyromegaly. Lungs:  Clear throughout to auscultation. Heart:  Regular rate and rhythm; no murmurs,  rubs,  or bruits. Abdomen:  Soft, nontender and nondistended. No masses, hepatosplenomegaly or hernias noted. Normal bowel sounds. Msk:  Symmetrical with no gross deformities. Normal posture. Pulses:  Normal pulses noted. Extremities:  No clubbing, cyanosis, edema or deformities noted. Neurologic:  Alert and  oriented x4;  grossly normal neurologically. Cervical Nodes:  No significant cervical adenopathy. Inguinal Nodes:  No significant inguinal adenopathy. Psych:  Alert and cooperative. Normal mood and affect.  Impression & Recommendations:  Problem # 1:  OTHER DYSPHAGIA (ICD-787.29) Dysphasia and chest pain with liquids. This only occurs when he drinks large amounts of liquids. Rule out strictures, esophagitis and neoplasms. The risks, benefits and alternatives to endoscopy with possible biopsy and possible dilation were discussed with the patient and they consent to proceed. The procedure will be scheduled electively. Orders: EGD SAV (EGD SAV)  Problem # 2:  GERD (ICD-530.81) Discontinue omeprazole and begin Dexilant 60 mg daily. Intensify all antireflux measures. Orders: EGD SAV (EGD SAV)  Problem # 3:  COLONIC POLYPS, HX OF (ICD-V12.72) History of adenomatous colon polyps, initially diagnosed in September 2002. Surveillance colonoscopy recommended September 2012.  Problem # 4:  HEPATITIS C-CHRONIC WITHOUT COMA (ICD-070.54) Followed medically specialty clinic.  Patient Instructions: 1)  Conscious Sedation brochure given.  2)  Upper Endoscopy with Dilatation brochure given.  3)  Start Dexilant samples one tablet by mouth once daily. 4)  Copy sent to : Oliver Barre, MD 5)  The medication list was reviewed and reconciled.  All changed / newly prescribed medications were explained.  A complete medication list was provided to the patient / caregiver. 6)  Avoid foods high in acid content ( tomatoes, citrus juices, spicy foods) . Avoid eating within 3 to 4 hours of lying down or before  exercising. Do not over eat; try smaller more frequent meals. Elevate head of bed four inches when sleeping.

## 2010-04-08 NOTE — Letter (Signed)
Summary: Initial Eval/Medical Specialty Services  Initial Eval/Medical Specialty Services   Imported By: Sherian Rein 03/21/2009 07:09:02  _____________________________________________________________________  External Attachment:    Type:   Image     Comment:   External Document

## 2010-04-08 NOTE — Progress Notes (Signed)
Summary: throat pain   Phone Note Call from Patient Call back at [phone]   Caller: Patient Call For: Dr. Russella Dar Reason for Call: Talk to Nurse Summary of Call: pt says he had EGD last week and is now experiencing some pain in his throat Initial call taken by: Vallarie Mare,  April 05, 2009 11:20 AM  Follow-up for Phone Call        Patient  devleoped a sore throat earlier this week  "a few days ago".  Pain is in the "neck area".  Denies any further dysphagia.  Patient advised unlikely to be from EGD on 03/27/09.  Patient  is taking omeprazole.  He is advised this maybe a cold symptom.  He is asked to call back or see his primary care if there is no improvement next week.   Follow-up by: Darcey Nora RN, CGRN,  April 05, 2009 11:35 AM  Additional Follow-up for Phone Call Additional follow up Details #1::        Agree. Additional Follow-up by: Meryl Dare MD Clementeen Graham,  April 05, 2009 12:58 PM

## 2010-04-08 NOTE — Assessment & Plan Note (Signed)
Summary: YEARLY FU/ NWS  #   Vital Signs:  Patient profile:   65 year old male Height:      73 inches Weight:      226 pounds BMI:     29.92 O2 Sat:      97 % on Room air Temp:     99.1 degrees F oral Pulse rate:   95 / minute BP sitting:   108 / 64  (left arm) Cuff size:   large  Vitals Entered By: Zella Ball Ewing CMA Duncan Dull) (October 16, 2009 1:17 PM)  O2 Flow:  Room air  CC: yearly followup/RE   Primary Care Maddock Finigan:  Oliver Barre, MD  CC:  yearly followup/RE.  History of Present Illness: here to f/u back pain - worse after lifting boxes with moving last wk;  higher strength pain med helped before that; overall character of the pain seems the same, just more persitsent and severe;   Pt denies CP, worsening sob, doe, wheezing, orthopnea, pnd, worsening LE edema, palps, dizziness or syncope  Pt denies other  new neuro symptoms such as headache, facial or extremity weakness .  No bowel or bladder changes.  NO falls or injury.  No fever, wt loss, night sweats, loss of appetite or other constitutional symptoms  No worsening weakness numbness.   last saw [facility] surgeon approx 2 mo ago - and felt surgury could be deferred as long as pain could be controlled and he functioed as well as he did.   Has also been told he needs bilat knee replacement per rheum but ortho says to hold off for now - not quite bad enough yet.    Problems Prior to Update: 1)  Preventive Health Care  (ICD-V70.0) 2)  Lumbar Radiculopathy, Right  (ICD-724.4) 3)  Chest Pain  (ICD-786.50) 4)  Other Dysphagia  (ICD-787.29) 5)  Lumbar Radiculopathy, Left  (ICD-724.4) 6)  Abdominal Pain-periumbilical  (ICD-789.05) 7)  Hepatitis C-chronic Without Coma  (ICD-070.54) 8)  Other Nonspecific Finding Examination of Urine  (ICD-791.9) 9)  Abdominal Pain, Unspecified Site  (ICD-789.00) 10)  Preventive Health Care  (ICD-V70.0) 11)  Osteoarthritis, Knees, Bilateral  (ICD-715.96) 12)  Chest Pain, Unspecified  (ICD-786.50) 13)   Cerebrovascular Accident, Hx of  (ICD-V12.50) 14)  Peptic Ulcer Disease  (ICD-533.90) 15)  Hyperlipidemia  (ICD-272.4) 16)  Colonic Polyps, Hx of  (ICD-V12.72) 17)  Gerd  (ICD-530.81) 18)  Low Back Pain  (ICD-724.2) 19)  Asthma  (ICD-493.90) 20)  Headache  (ICD-784.0) 21)  Rash-nonvesicular  (ICD-782.1) 22)  Obstructive Sleep Apnea  (ICD-327.23) 23)  Emphysema  (ICD-492.8) 24)  Intrinsic Asthma, Unspecified  (ICD-493.10) 25)  Hypertension  (ICD-401.9) 26)  COPD  (ICD-496) 27)  Allergic Rhinitis  (ICD-477.9)  Medications Prior to Update: 1)  Cetirizine Hcl 10 Mg Tabs (Cetirizine Hcl) .... Hold 1 Tablet By Mouth Once A Day Hold 2)  Cialis 20 Mg Tabs (Tadalafil) .... Take 1 Tablet By Mouth Once A Day 3)  B Complex 100   Tabs (B Complex Vitamins) .... Take 1 Tablet By Mouth Once A Day 4)  Dexilant 60 Mg Cpdr (Dexlansoprazole) .... One Tablet By Mouth Once Daily 5)  Flexeril 10 Mg  Tabs (Cyclobenzaprine Hcl) .... Take 1 To 2 Tabs By Mouth At Bedtime As Needed 6)  Ambien 10 Mg  Tabs (Zolpidem Tartrate) .... Take 1 Tab By Mouth At Bedtime 7)  Oxycodone Hcl 5 Mg Tabs (Oxycodone Hcl) .Marland Kitchen.. 1-2 By Mouth Q 6 Hrs As Needed Pain 8)  Clobetasol Propionate 0.05 %  Crea (Clobetasol Propionate) .... Use As Directed 1 Once Daily As Needed 9)  Meloxicam 15 Mg Tabs (Meloxicam) .Marland Kitchen.. 1po Once Daily As Needed 10)  Hyzaar 100-25 Mg Tabs (Losartan Potassium-Hctz) .Marland Kitchen.. 1 By Mouth Once Daily 11)  Omeprazole 20 Mg Cpdr (Omeprazole) .Marland Kitchen.. 1 By Mouth Two Times A Day 12)  Aspir-Low 81 Mg Tbec (Aspirin) .Marland Kitchen.. 1po Once Daily 13)  Prednisone 5 Mg Tabs (Prednisone) .... 3po Qd For 3days, Then 2po Qd For 3days, Then 1po Qd For 3days, Then Stop  Current Medications (verified): 1)  Cetirizine Hcl 10 Mg Tabs (Cetirizine Hcl) .... Hold 1 Tablet By Mouth Once A Day Hold 2)  Cialis 20 Mg Tabs (Tadalafil) .... Take 1 Tablet By Mouth Once A Day 3)  B Complex 100   Tabs (B Complex Vitamins) .... Take 1 Tablet By Mouth Once A  Day 4)  Dexilant 60 Mg Cpdr (Dexlansoprazole) .... One Tablet By Mouth Once Daily 5)  Flexeril 10 Mg  Tabs (Cyclobenzaprine Hcl) .... Take 1 To 2 Tabs By Mouth At Bedtime As Needed 6)  Ambien 10 Mg  Tabs (Zolpidem Tartrate) .... Take 1 Tab By Mouth At Bedtime 7)  Oxycodone Hcl 5 Mg Tabs (Oxycodone Hcl) .Marland Kitchen.. 1-2 By Mouth Q 6 Hrs As Needed Pain 8)  Clobetasol Propionate 0.05 %  Crea (Clobetasol Propionate) .... Use As Directed 1 Once Daily As Needed 9)  Meloxicam 15 Mg Tabs (Meloxicam) .Marland Kitchen.. 1po Once Daily As Needed 10)  Hyzaar 100-25 Mg Tabs (Losartan Potassium-Hctz) .Marland Kitchen.. 1 By Mouth Once Daily 11)  Omeprazole 20 Mg Cpdr (Omeprazole) .Marland Kitchen.. 1 By Mouth Two Times A Day 12)  Aspir-Low 81 Mg Tbec (Aspirin) .Marland Kitchen.. 1po Once Daily 13)  Prednisone 5 Mg Tabs (Prednisone) .... 3po Qd For 3days, Then 2po Qd For 3days, Then 1po Qd For 3days, Then Stop  Allergies (verified): 1)  ! Darvocet 2)  ! * Latex  Past History:  Past Medical History: Last updated: 03/20/2009 Allergic rhinitis COPD Hypertension OSA - mild Asthma E.D. normal cors on cath 8/08 right knee djd Low back pain GERD Colonic polyps, hx of - adenoma 11/2000 hepatitis C Hyperlipidemia Peptic ulcer disease ? RA - truslow hx of alcohol dependency Sleep Apnea  Past Surgical History: Last updated: 03/20/2009 umbilical hernia repair  Social History: Last updated: 10/27/2007 Former Smoker-stopped 3 years ago Married 1 child disabled Alcohol use-yes-1-2 drinks weekly Illicit Drug Use - no Patient gets regular exercise.  Risk Factors: Alcohol Use: <1 (10/08/2008) Exercise: yes (10/27/2007)  Risk Factors: Smoking Status: quit (10/08/2008) Packs/Day: 1/2 pks (10/08/2008)  Review of Systems       all otherwise negative per pt -    Physical Exam  General:  alert and overweight-appearing.   Head:  normocephalic and atraumatic.   Eyes:  vision grossly intact, pupils equal, and pupils round.   Ears:  R ear normal and L  ear normal.   Nose:  no external deformity and no nasal discharge.   Mouth:  no gingival abnormalities and pharynx pink and moist.   Neck:  supple and no masses.   Lungs:  normal respiratory effort and normal breath sounds.   Heart:  normal rate and regular rhythm.   Abdomen:  soft, non-tender, and normal bowel sounds.   Msk:  no spine or paraspinal tenderness, swelling or erythema;  no SI joint area tenderness as well Extremities:  no edema, no erythema  Neurologic:  cranial nerves II-XII intact, strength normal  in all extremities, sensation intact to light touch, and DTRs symmetrical and normal.  except for bilat hip flexion weakness (4/5)  - appears to be limited due to pain Skin:  color normal and no rashes.   Psych:  not anxious appearing and not depressed appearing.     Impression & Recommendations:  Problem # 1:  LUMBAR RADICULOPATHY, RIGHT (ICD-724.4)  His updated medication list for this problem includes:    Flexeril 10 Mg Tabs (Cyclobenzaprine hcl) .Marland Kitchen... Take 1 to 2 tabs by mouth at bedtime as needed    Oxycodone Hcl 5 Mg Tabs (Oxycodone hcl) .Marland Kitchen... 1-2 by mouth q 6 hrs as needed pain    Meloxicam 15 Mg Tabs (Meloxicam) .Marland Kitchen... 1po once daily as needed    Aspir-low 81 Mg Tbec (Aspirin) .Marland Kitchen... 1po once daily overall stable exam but subjective pain worse with recent lifing;  will need to limit lifting to 20 lbs,  cont pain meds, and repeat prednisone pack, f/u ortho with any worsening  Problem # 2:  HYPERTENSION (ICD-401.9)  His updated medication list for this problem includes:    Hyzaar 100-25 Mg Tabs (Losartan potassium-hctz) .Marland Kitchen... 1 by mouth once daily  BP today: 108/64 Prior BP: 118/72 (09/25/2009)  Labs Reviewed: K+: 4.0 (09/25/2009) Creat: : 1.3 (09/25/2009)   Chol: 168 (09/25/2009)   HDL: 47.10 (09/25/2009)   LDL: 92 (09/25/2009)   TG: 147.0 (09/25/2009) stable overall by hx and exam, ok to continue meds/tx as is   Orders: EKG w/ Interpretation (93000)  Problem #  3:  COPD (ICD-496) stable overall by hx and exam, ok to continue meds/tx as is - does not require meds at this time  Complete Medication List: 1)  Cetirizine Hcl 10 Mg Tabs (Cetirizine hcl) .... Hold 1 tablet by mouth once a day hold 2)  Cialis 20 Mg Tabs (Tadalafil) .... Take 1 tablet by mouth once a day 3)  B Complex 100 Tabs (B complex vitamins) .... Take 1 tablet by mouth once a day 4)  Dexilant 60 Mg Cpdr (Dexlansoprazole) .... One tablet by mouth once daily 5)  Flexeril 10 Mg Tabs (Cyclobenzaprine hcl) .... Take 1 to 2 tabs by mouth at bedtime as needed 6)  Ambien 10 Mg Tabs (Zolpidem tartrate) .... Take 1 tab by mouth at bedtime 7)  Oxycodone Hcl 5 Mg Tabs (Oxycodone hcl) .Marland Kitchen.. 1-2 by mouth q 6 hrs as needed pain 8)  Clobetasol Propionate 0.05 % Crea (Clobetasol propionate) .... Use as directed 1 once daily as needed 9)  Meloxicam 15 Mg Tabs (Meloxicam) .Marland Kitchen.. 1po once daily as needed 10)  Hyzaar 100-25 Mg Tabs (Losartan potassium-hctz) .Marland Kitchen.. 1 by mouth once daily 11)  Omeprazole 20 Mg Cpdr (Omeprazole) .Marland Kitchen.. 1 by mouth two times a day 12)  Aspir-low 81 Mg Tbec (Aspirin) .Marland Kitchen.. 1po once daily 13)  Prednisone 5 Mg Tabs (Prednisone) .... 3po qd for 3days, then 2po qd for 3days, then 1po qd for 3days, then stop  Patient Instructions: 1)  Please take all new medications as prescribed  2)  Continue all previous medications as before this visit  3)  Please schedule a follow-up appointment in 1 year or sooner if needed Prescriptions: FLEXERIL 10 MG  TABS (CYCLOBENZAPRINE HCL) take 1 to 2 tabs by mouth at bedtime as needed  #60 x 5   Entered and Authorized by:   Corwin Levins MD   Signed by:   Corwin Levins MD on 10/16/2009   Method used:  Print then Give to Patient   RxID:   [id number] AMBIEN 10 MG  TABS (ZOLPIDEM TARTRATE) Take 1 tab by mouth at bedtime  #90 x 1   Entered and Authorized by:   Corwin Levins MD   Signed by:   Corwin Levins MD on 10/16/2009   Method used:   Print then Give  to Patient   RxID:   [id number] MELOXICAM 15 MG TABS (MELOXICAM) 1po once daily as needed  #90 x 3   Entered and Authorized by:   Corwin Levins MD   Signed by:   Corwin Levins MD on 10/16/2009   Method used:   Print then Give to Patient   RxID:   [id number] CIALIS 20 MG TABS (TADALAFIL) Take 1 tablet by mouth once a day  #10 x 11   Entered and Authorized by:   Corwin Levins MD   Signed by:   Corwin Levins MD on 10/16/2009   Method used:   Print then Give to Patient   RxID:   [id number] OXYCODONE HCL 5 MG TABS (OXYCODONE HCL) 1-2 by mouth q 6 hrs as needed pain  #120 x 0   Entered and Authorized by:   Corwin Levins MD   Signed by:   Corwin Levins MD on 10/16/2009   Method used:   Print then Give to Patient   RxID:   [id number] PREDNISONE 5 MG TABS (PREDNISONE) 3po qd for 3days, then 2po qd for 3days, then 1po qd for 3days, then stop  #18 x 0   Entered and Authorized by:   Corwin Levins MD   Signed by:   Corwin Levins MD on 10/16/2009   Method used:   Print then Give to Patient   RxID:   [id number]

## 2010-04-08 NOTE — Letter (Signed)
Summary: Neurosurgery/[facility]  Neurosurgery/[facility]   Imported By: Lester [place] 09/26/2009 09:46:22  _____________________________________________________________________  External Attachment:    Type:   Image     Comment:   External Document

## 2010-04-08 NOTE — Progress Notes (Signed)
  Phone Note Refill Request  on Aug 02, 2009 1:36 PM  Refills Requested: Medication #1:  CLOBETASOL PROPIONATE 0.05 %  CREA use as directed 1 once daily as needed   Dosage confirmed as above?Dosage Confirmed   Notes: Public affairs consultant pharmacy Initial call taken by: Scharlene Gloss,  Aug 02, 2009 1:36 PM    Prescriptions: CLOBETASOL PROPIONATE 0.05 %  CREA (CLOBETASOL PROPIONATE) use as directed 1 once daily as needed  #15 Gram x 6   Entered by:   Scharlene Gloss   Authorized by:   Corwin Levins MD   Signed by:   Scharlene Gloss on 08/02/2009   Method used:   Faxed to ...       [facility]       [address]       [place], [place]  [address]       Ph: [phone]       Fax: [phone]   RxID:   [id number]

## 2010-04-08 NOTE — Progress Notes (Signed)
Summary: Medication refill   Phone Note Call from Patient Call back at [phone]   Caller: Patient Call For: Dr. Russella Dar Reason for Call: Refill Medication Summary of Call: Pt needs Omeprazole refilled Initial call taken by: Karna Christmas,  May 29, 2009 4:12 PM  Follow-up for Phone Call        Told pt that Dr. Melvyn Novas office has denied this medication b/c he should be on Dexilant. Pt states the Dexilant was too expensive and needs a refill of omeprazole. I told pt to call Dr. Raphael Gibney office b/c that is who has been filling this medicine or if he wants [place] to take over prescriptions we can. To let us know either way.   Follow-up by: Christie Nottingham CMA Duncan Dull),  May 29, 2009 4:23 PM

## 2010-04-08 NOTE — Assessment & Plan Note (Signed)
Summary: chest issues/lb   Vital Signs:  Patient profile:   65 year old male Height:      73 inches Weight:      232.50 pounds BMI:     30.79 O2 Sat:      98 % on Room air Temp:     98.7 degrees F oral Pulse rate:   84 / minute BP sitting:   124 / 80  (left arm) Cuff size:   large  Vitals Entered ByZella Ball Ewing (June 27, 2009 3:54 PM)  O2 Flow:  Room air  CC: ER Followup/RE   Primary Care Provider:  Oliver Barre, MD  CC:  ER Followup/RE.  History of Present Illness: here after being seen at an ER at [facility] last sat night (5 days ago);  eval there "did not detect anything serious" but asked to f/u here;  pain locaiont was mid to upper sternal area;  dull,. no radaition, no assoc with sob, or nausea, or sweats, palp, orhtopnea, pnd or LE edema, palp's ,dizzness, or syncope.  Had ekgm, cxr , blood tests in the ER - neg per pt.  Tele for several hours neg.  Tx with 5 x 81 mg asa.  Overall lasted 2 to 3hrs,  not treated with pain meds or NTG but seemed to resolve on its own.  Had significant anxiety as well.  Palpation of the chest at the time  - not clear if reporduced the pain.  Pain was nonexertional, may have been mild pleuritic to start, but not later.  No GI cocktail given.  He think now he had some stress on the front of the chest with driving for 6 hrs to get to [place].   2004 EGD with hiatal hernia.  Last stress test approx 5 or more yrs ago - neg.   No pain since then. No feve, ST, or cough/.  Not gotten back to his gym excercise but plans to.   Pt denies other CP, sob, doe, wheezing, orthopnea, pnd, worsening LE edema, palps, dizziness or syncope  Pt denies new neuro symptoms such as headache, facial or extremity weakness   Problems Prior to Update: 1)  Chest Pain  (ICD-786.50) 2)  Other Dysphagia  (ICD-787.29) 3)  Lumbar Radiculopathy, Left  (ICD-724.4) 4)  Abdominal Pain-periumbilical  (ICD-789.05) 5)  Hepatitis C-chronic Without Coma  (ICD-070.54) 6)  Other  Nonspecific Finding Examination of Urine  (ICD-791.9) 7)  Abdominal Pain, Unspecified Site  (ICD-789.00) 8)  Preventive Health Care  (ICD-V70.0) 9)  Osteoarthritis, Knees, Bilateral  (ICD-715.96) 10)  Chest Pain, Unspecified  (ICD-786.50) 11)  Cerebrovascular Accident, Hx of  (ICD-V12.50) 12)  Peptic Ulcer Disease  (ICD-533.90) 13)  Hyperlipidemia  (ICD-272.4) 14)  Colonic Polyps, Hx of  (ICD-V12.72) 15)  Gerd  (ICD-530.81) 16)  Low Back Pain  (ICD-724.2) 17)  Asthma  (ICD-493.90) 18)  Headache  (ICD-784.0) 19)  Rash-nonvesicular  (ICD-782.1) 20)  Obstructive Sleep Apnea  (ICD-327.23) 21)  Emphysema  (ICD-492.8) 22)  Intrinsic Asthma, Unspecified  (ICD-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 Hold 1 Tablet By Mouth Once A Day Hold 2)  Cialis 20 Mg Tabs (Tadalafil) .... Take 1 Tablet By Mouth Once A Day 3)  B Complex 100   Tabs (B Complex Vitamins) .... Take 1 Tablet By Mouth Once A Day 4)  Dexilant 60  Mg Cpdr (Dexlansoprazole) .... One Tablet By Mouth Once Daily 5)  Flexeril 10 Mg  Tabs (Cyclobenzaprine Hcl) .... Take 1 To 2 Tabs By Mouth At Bedtime As Needed 6)  Ambien 10 Mg  Tabs (Zolpidem Tartrate) .... Take 1 Tab By Mouth At Bedtime 7)  Hydrocodone-Acetaminophen 5-325 Mg Tabs (Hydrocodone-Acetaminophen) .Marland Kitchen.. 1po Q 6 Hrs As Needed Pain 8)  Clobetasol Propionate 0.05 %  Crea (Clobetasol Propionate) .... Use As Directed 1 Once Daily As Needed 9)  Meloxicam 15 Mg Tabs (Meloxicam) .Marland Kitchen.. 1po Once Daily As Needed 10)  Hyzaar 100-25 Mg Tabs (Losartan Potassium-Hctz) .Marland Kitchen.. 1 By Mouth Once Daily 11)  Azithromycin 250 Mg Tabs (Azithromycin) .... 2po Qd For 1 Day, Then 1po Qd For 4days, Then Stop 12)  Tessalon Perles 100 Mg Caps (Benzonatate) .Marland Kitchen.. 1 - 2 By Mouth Three Times A Day As Needed 13)  Omeprazole 20 Mg Cpdr (Omeprazole) .Marland Kitchen.. 1 By Mouth Two Times A  Day  Current Medications (verified): 1)  Cetirizine Hcl 10 Mg Tabs (Cetirizine Hcl) .... Hold 1 Tablet By Mouth Once A Day Hold 2)  Cialis 20 Mg Tabs (Tadalafil) .... Take 1 Tablet By Mouth Once A Day 3)  B Complex 100   Tabs (B Complex Vitamins) .... Take 1 Tablet By Mouth Once A Day 4)  Dexilant 60 Mg Cpdr (Dexlansoprazole) .... One Tablet By Mouth Once Daily 5)  Flexeril 10 Mg  Tabs (Cyclobenzaprine Hcl) .... Take 1 To 2 Tabs By Mouth At Bedtime As Needed 6)  Ambien 10 Mg  Tabs (Zolpidem Tartrate) .... Take 1 Tab By Mouth At Bedtime 7)  Hydrocodone-Acetaminophen 5-325 Mg Tabs (Hydrocodone-Acetaminophen) .Marland Kitchen.. 1po Q 6 Hrs As Needed Pain 8)  Clobetasol Propionate 0.05 %  Crea (Clobetasol Propionate) .... Use As Directed 1 Once Daily As Needed 9)  Meloxicam 15 Mg Tabs (Meloxicam) .Marland Kitchen.. 1po Once Daily As Needed 10)  Hyzaar 100-25 Mg Tabs (Losartan Potassium-Hctz) .Marland Kitchen.. 1 By Mouth Once Daily 11)  Omeprazole 20 Mg Cpdr (Omeprazole) .Marland Kitchen.. 1 By Mouth Two Times A Day 12)  Aspir-Low 81 Mg Tbec (Aspirin) .Marland Kitchen.. 1po Once Daily  Allergies (verified): 1)  ! Darvocet 2)  ! * Latex  Past History:  Past Medical History: Last updated: 03/20/2009 Allergic rhinitis COPD Hypertension OSA - mild Asthma E.D. normal cors on cath 8/08 right knee djd Low back pain GERD Colonic polyps, hx of - adenoma 11/2000 hepatitis C Hyperlipidemia Peptic ulcer disease ? RA - truslow hx of alcohol dependency Sleep Apnea  Past Surgical History: Last updated: 03/20/2009 umbilical hernia repair  Social History: Last updated: 10/27/2007 Former Smoker-stopped 3 years ago Married 1 child disabled Alcohol use-yes-1-2 drinks weekly Illicit Drug Use - no Patient gets regular exercise.  Risk Factors: Alcohol Use: <1 (10/08/2008) Exercise: yes (10/27/2007)  Risk Factors: Smoking Status: quit (10/08/2008) Packs/Day: 1/2 pks (10/08/2008)  Review of Systems       all otherwise negative per pt -    Physical  Exam  General:  alert and overweight-appearing.   Head:  normocephalic and atraumatic.   Eyes:  vision grossly intact, pupils equal, and pupils round.   Ears:  R ear normal and L ear normal.   Nose:  no external deformity and no nasal discharge.   Mouth:  no gingival abnormalities and pharynx pink and moist.   Neck:  supple and no masses.   Lungs:  normal respiratory effort and normal breath sounds.   Heart:  normal rate and regular rhythm.   Abdomen:  soft, non-tender, and normal bowel sounds.   Msk:  no joint tenderness and no joint swelling.  , no chest wall tender Extremities:  no edema, no erythema    Impression & Recommendations:  Problem # 1:  CHEST PAIN (ICD-786.50) etiology unclear, ? MSK but cant r/o cardiac given age and risk factors - for ECG and stress test to further evaluate with atypical pain Orders: EKG w/ Interpretation (93000) Cardiolite (Cardiolite)  Problem # 2:  HYPERTENSION (ICD-401.9)  His updated medication list for this problem includes:    Hyzaar 100-25 Mg Tabs (Losartan potassium-hctz) .Marland Kitchen... 1 by mouth once daily  BP today: 124/80 Prior BP: 122/74 (03/20/2009)  Labs Reviewed: K+: 4.1 (10/05/2008) Creat: : 1.2 (10/05/2008)   Chol: 165 (10/05/2008)   HDL: 50.30 (10/05/2008)   LDL: 102 (10/05/2008)   TG: 63.0 (10/05/2008) stable overall by hx and exam, ok to continue meds/tx as is   Problem # 3:  COPD (ICD-496) .stabel - does not appear symtpomatic or need for change in med tx at this time  Complete Medication List: 1)  Cetirizine Hcl 10 Mg Tabs (Cetirizine hcl) .... Hold 1 tablet by mouth once a day hold 2)  Cialis 20 Mg Tabs (Tadalafil) .... Take 1 tablet by mouth once a day 3)  B Complex 100 Tabs (B complex vitamins) .... Take 1 tablet by mouth once a day 4)  Dexilant 60 Mg Cpdr (Dexlansoprazole) .... One tablet by mouth once daily 5)  Flexeril 10 Mg Tabs (Cyclobenzaprine hcl) .... Take 1 to 2 tabs by mouth at bedtime as needed 6)  Ambien 10  Mg Tabs (Zolpidem tartrate) .... Take 1 tab by mouth at bedtime 7)  Hydrocodone-acetaminophen 5-325 Mg Tabs (Hydrocodone-acetaminophen) .Marland Kitchen.. 1po q 6 hrs as needed pain 8)  Clobetasol Propionate 0.05 % Crea (Clobetasol propionate) .... Use as directed 1 once daily as needed 9)  Meloxicam 15 Mg Tabs (Meloxicam) .Marland Kitchen.. 1po once daily as needed 10)  Hyzaar 100-25 Mg Tabs (Losartan potassium-hctz) .Marland Kitchen.. 1 by mouth once daily 11)  Omeprazole 20 Mg Cpdr (Omeprazole) .Marland Kitchen.. 1 by mouth two times a day 12)  Aspir-low 81 Mg Tbec (Aspirin) .Marland Kitchen.. 1po once daily  Patient Instructions: 1)  Your EKG was OK today 2)  Take an Aspirin every day - 81 mg - 1 per day - COATED only 3)  Continue all previous medications as before this visit  4)  You will be contacted about the referral(s) to: Stress test 5)  Please schedule a follow-up appointment in 3 months with CPX labs

## 2010-04-08 NOTE — Progress Notes (Signed)
Summary: RX change  Phone Note Call from Patient   Caller: Patient [phone] Summary of Call: pt called stating that he cannot afford Rx for Dexilant. pt is requesting refills of Omeprazole as an alternate. [facility] [address] Molli Knock to change back and fill? Initial call taken by: Margaret Pyle, CMA,  May 29, 2009 4:35 PM  Follow-up for Phone Call        ok to change to omeprazole 20 mg - 2 per day - to robin to handle Follow-up by: Corwin Levins MD,  May 29, 2009 6:04 PM    New/Updated Medications: OMEPRAZOLE 20 MG CPDR (OMEPRAZOLE) 1 by mouth two times a day Prescriptions: OMEPRAZOLE 20 MG CPDR (OMEPRAZOLE) 1 by mouth two times a day  #60 x 11   Entered by:   Scharlene Gloss   Authorized by:   Corwin Levins MD   Signed by:   Scharlene Gloss on 05/30/2009   Method used:   Faxed to ...       [facility]       [address]       [place], [place]  [address]       Ph: [phone]       Fax: [phone]   RxID:   [id number]

## 2010-04-08 NOTE — Assessment & Plan Note (Signed)
Summary: BACK PAIN  STC   Vital Signs:  Patient profile:   65 year old male Height:      73.5 inches Weight:      231.50 pounds BMI:     30.24 O2 Sat:      98 % on Room air Temp:     97.9 degrees F oral Pulse rate:   97 / minute BP sitting:   118 / 72  (left arm) Cuff size:   large  Vitals Entered By: Zella Ball Ewing CMA Duncan Dull) (September 25, 2009 3:36 PM)  O2 Flow:  Room air  CC: Back Pain/RE   Primary Care Provider:  Oliver Barre, MD  CC:  Back Pain/RE.  History of Present Illness: here after being seen at [facility] with x[facility] lower back; suggested to him that as long as he was not worse with pain and impairment he could put off surgury;  since then in the past 2 wks has had increased pain to the right side, radiation to just past the right knee; no numbness or weakness but can  only stand about 20 min befores sevee increased pain;  no bowel or bladder function, no gait change, fall or other injury.  No fever, recent wt loss, low appetitie.  Tried to take 2 of his hydrocodon 5. 325 but did not work.   Problems Prior to Update: 1)  Preventive Health Care  (ICD-V70.0) 2)  Lumbar Radiculopathy, Right  (ICD-724.4) 3)  Chest Pain  (ICD-786.50) 4)  Other Dysphagia  (ICD-787.29) 5)  Lumbar Radiculopathy, Left  (ICD-724.4) 6)  Abdominal Pain-periumbilical  (ICD-789.05) 7)  Hepatitis C-chronic Without Coma  (ICD-070.54) 8)  Other Nonspecific Finding Examination of Urine  (ICD-791.9) 9)  Abdominal Pain, Unspecified Site  (ICD-789.00) 10)  Preventive Health Care  (ICD-V70.0) 11)  Osteoarthritis, Knees, Bilateral  (ICD-715.96) 12)  Chest Pain, Unspecified  (ICD-786.50) 13)  Cerebrovascular Accident, Hx of  (ICD-V12.50) 14)  Peptic Ulcer Disease  (ICD-533.90) 15)  Hyperlipidemia  (ICD-272.4) 16)  Colonic Polyps, Hx of  (ICD-V12.72) 17)  Gerd  (ICD-530.81) 18)  Low Back Pain  (ICD-724.2) 19)  Asthma  (ICD-493.90) 20)  Headache  (ICD-784.0) 21)  Rash-nonvesicular  (ICD-782.1) 22)   Obstructive Sleep Apnea  (ICD-327.23) 23)  Emphysema  (ICD-492.8) 24)  Intrinsic Asthma, Unspecified  (ICD-493.10) 25)  Hypertension  (ICD-401.9) 26)  COPD  (ICD-496) 27)  Allergic Rhinitis  (ICD-477.9)  Medications Prior to Update: 1)  Cetirizine Hcl 10 Mg Tabs (Cetirizine Hcl) .... Hold 1 Tablet By Mouth Once A Day Hold 2)  Cialis 20 Mg Tabs (Tadalafil) .... Take 1 Tablet By Mouth Once A Day 3)  B Complex 100   Tabs (B Complex Vitamins) .... Take 1 Tablet By Mouth Once A Day 4)  Dexilant 60 Mg Cpdr (Dexlansoprazole) .... One Tablet By Mouth Once Daily 5)  Flexeril 10 Mg  Tabs (Cyclobenzaprine Hcl) .... Take 1 To 2 Tabs By Mouth At Bedtime As Needed 6)  Ambien 10 Mg  Tabs (Zolpidem Tartrate) .... Take 1 Tab By Mouth At Bedtime 7)  Hydrocodone-Acetaminophen 5-325 Mg Tabs (Hydrocodone-Acetaminophen) .Marland Kitchen.. 1po Q 6 Hrs As Needed Pain 8)  Clobetasol Propionate 0.05 %  Crea (Clobetasol Propionate) .... Use As Directed 1 Once Daily As Needed 9)  Meloxicam 15 Mg Tabs (Meloxicam) .Marland Kitchen.. 1po Once Daily As Needed 10)  Hyzaar 100-25 Mg Tabs (Losartan Potassium-Hctz) .Marland Kitchen.. 1 By Mouth Once Daily 11)  Omeprazole 20 Mg Cpdr (Omeprazole) .Marland Kitchen.. 1 By Mouth Two Times A  Day 12)  Aspir-Low 81 Mg Tbec (Aspirin) .Marland Kitchen.. 1po Once Daily  Current Medications (verified): 1)  Cetirizine Hcl 10 Mg Tabs (Cetirizine Hcl) .... Hold 1 Tablet By Mouth Once A Day Hold 2)  Cialis 20 Mg Tabs (Tadalafil) .... Take 1 Tablet By Mouth Once A Day 3)  B Complex 100   Tabs (B Complex Vitamins) .... Take 1 Tablet By Mouth Once A Day 4)  Dexilant 60 Mg Cpdr (Dexlansoprazole) .... One Tablet By Mouth Once Daily 5)  Flexeril 10 Mg  Tabs (Cyclobenzaprine Hcl) .... Take 1 To 2 Tabs By Mouth At Bedtime As Needed 6)  Ambien 10 Mg  Tabs (Zolpidem Tartrate) .... Take 1 Tab By Mouth At Bedtime 7)  Oxycodone Hcl 5 Mg Tabs (Oxycodone Hcl) .Marland Kitchen.. 1-2 By Mouth Q 6 Hrs As Needed Pain 8)  Clobetasol Propionate 0.05 %  Crea (Clobetasol Propionate) .... Use  As Directed 1 Once Daily As Needed 9)  Meloxicam 15 Mg Tabs (Meloxicam) .Marland Kitchen.. 1po Once Daily As Needed 10)  Hyzaar 100-25 Mg Tabs (Losartan Potassium-Hctz) .Marland Kitchen.. 1 By Mouth Once Daily 11)  Omeprazole 20 Mg Cpdr (Omeprazole) .Marland Kitchen.. 1 By Mouth Two Times A Day 12)  Aspir-Low 81 Mg Tbec (Aspirin) .Marland Kitchen.. 1po Once Daily 13)  Prednisone 5 Mg Tabs (Prednisone) .... 3po Qd For 3days, Then 2po Qd For 3days, Then 1po Qd For 3days, Then Stop  Allergies (verified): 1)  ! Darvocet 2)  ! * Latex  Past History:  Past Medical History: Last updated: 03/20/2009 Allergic rhinitis COPD Hypertension OSA - mild Asthma E.D. normal cors on cath 8/08 right knee djd Low back pain GERD Colonic polyps, hx of - adenoma 11/2000 hepatitis C Hyperlipidemia Peptic ulcer disease ? RA - truslow hx of alcohol dependency Sleep Apnea  Past Surgical History: Last updated: 03/20/2009 umbilical hernia repair  Family History: Last updated: 03/22/2007 adopted daughter with DM  Social History: Last updated: 10/27/2007 Former Smoker-stopped 3 years ago Married 1 child disabled Alcohol use-yes-1-2 drinks weekly Illicit Drug Use - no Patient gets regular exercise.  Risk Factors: Alcohol Use: <1 (10/08/2008) Exercise: yes (10/27/2007)  Risk Factors: Smoking Status: quit (10/08/2008) Packs/Day: 1/2 pks (10/08/2008)  Review of Systems  The patient denies anorexia, fever, weight loss, weight gain, vision loss, decreased hearing, hoarseness, chest pain, syncope, dyspnea on exertion, peripheral edema, prolonged cough, headaches, hemoptysis, abdominal pain, melena, hematochezia, severe indigestion/heartburn, hematuria, muscle weakness, suspicious skin lesions, transient blindness, difficulty walking, depression, unusual weight change, abnormal bleeding, enlarged lymph nodes, and angioedema.         all otherwise negative per pt -    Physical Exam  General:  alert and overweight-appearing.   Head:   normocephalic and atraumatic.   Eyes:  vision grossly intact, pupils equal, and pupils round.   Ears:  R ear normal and L ear normal.   Nose:  no external deformity and no nasal discharge.   Mouth:  no gingival abnormalities and fair dentition.   Neck:  supple and no masses.   Lungs:  normal respiratory effort and normal breath sounds.   Heart:  normal rate and regular rhythm.   Abdomen:  soft, non-tender, and normal bowel sounds.   Msk:  no joint tenderness and no joint swelling.  , spine nontender; has mod right lumbar paravertebral tedner Extremities:  no edema, no erythema  Neurologic:  cranial nerves II-XII intact and strength normal in all extremities.   Skin:  color normal and no rashes.   Psych:  not depressed appearing and slightly anxious.     Impression & Recommendations:  Problem # 1:  Preventive Health Care (ICD-V70.0)  Overall doing well, age appropriate education and counseling updated and referral for appropriate preventive services done unless declined, immunizations up to date or declined, diet counseling done if overweight, urged to quit smoking if smokes , most recent labs reviewed and current ordered if appropriate, ecg reviewed or declined (interpretation per ECG scanned in the EMR if done); information regarding Medicare Prevention requirements given if appropriate; speciality referrals updated as appropriate   Orders: TLB-BMP (Basic Metabolic Panel-BMET) (80048-METABOL) TLB-CBC Platelet - w/Differential (85025-CBCD) TLB-Hepatic/Liver Function Pnl (80076-HEPATIC) TLB-TSH (Thyroid Stimulating Hormone) (84443-TSH) TLB-PSA (Prostate Specific Antigen) (84153-PSA) TLB-Lipid Panel (80061-LIPID) TLB-Udip ONLY (81003-UDIP)  Problem # 2:  LUMBAR RADICULOPATHY, RIGHT (ICD-724.4)  His updated medication list for this problem includes:    Flexeril 10 Mg Tabs (Cyclobenzaprine hcl) .Marland Kitchen... Take 1 to 2 tabs by mouth at bedtime as needed    Oxycodone Hcl 5 Mg Tabs (Oxycodone  hcl) .Marland Kitchen... 1-2 by mouth q 6 hrs as needed pain    Meloxicam 15 Mg Tabs (Meloxicam) .Marland Kitchen... 1po once daily as needed    Aspir-low 81 Mg Tbec (Aspirin) .Marland Kitchen... 1po once daily exac of pain - for temporary change to oxycodone as needed, and pred pack  Complete Medication List: 1)  Cetirizine Hcl 10 Mg Tabs (Cetirizine hcl) .... Hold 1 tablet by mouth once a day hold 2)  Cialis 20 Mg Tabs (Tadalafil) .... Take 1 tablet by mouth once a day 3)  B Complex 100 Tabs (B complex vitamins) .... Take 1 tablet by mouth once a day 4)  Dexilant 60 Mg Cpdr (Dexlansoprazole) .... One tablet by mouth once daily 5)  Flexeril 10 Mg Tabs (Cyclobenzaprine hcl) .... Take 1 to 2 tabs by mouth at bedtime as needed 6)  Ambien 10 Mg Tabs (Zolpidem tartrate) .... Take 1 tab by mouth at bedtime 7)  Oxycodone Hcl 5 Mg Tabs (Oxycodone hcl) .Marland Kitchen.. 1-2 by mouth q 6 hrs as needed pain 8)  Clobetasol Propionate 0.05 % Crea (Clobetasol propionate) .... Use as directed 1 once daily as needed 9)  Meloxicam 15 Mg Tabs (Meloxicam) .Marland Kitchen.. 1po once daily as needed 10)  Hyzaar 100-25 Mg Tabs (Losartan potassium-hctz) .Marland Kitchen.. 1 by mouth once daily 11)  Omeprazole 20 Mg Cpdr (Omeprazole) .Marland Kitchen.. 1 by mouth two times a day 12)  Aspir-low 81 Mg Tbec (Aspirin) .Marland Kitchen.. 1po once daily 13)  Prednisone 5 Mg Tabs (Prednisone) .... 3po qd for 3days, then 2po qd for 3days, then 1po qd for 3days, then stop  Patient Instructions: 1)  Please take all new medications as prescribed  - the higher strength pain medication, and the prednisone for anti-inflammatory 2)  Continue all previous medications as before this visit , except do not take the hydrocodon when you take the oxycodone  3)  Please return to surgury at [facility] if the pain persists 4)  Please go to the Lab in the basement for your blood and/or urine tests today 5)  Please schedule a follow-up appointment in 1 year or sooner if needed  Prescriptions: PREDNISONE 5 MG TABS (PREDNISONE) 3po qd for 3days, then  2po qd for 3days, then 1po qd for 3days, then stop  #18 x 0   Entered and Authorized by:   Corwin Levins MD   Signed by:   Corwin Levins MD on 09/25/2009   Method used:   Print then Give to  Patient   RxID:   [id number] OXYCODONE HCL 5 MG TABS (OXYCODONE HCL) 1-2 by mouth q 6 hrs as needed pain  #60 x 0   Entered and Authorized by:   Corwin Levins MD   Signed by:   Corwin Levins MD on 09/25/2009   Method used:   Print then Give to Patient   RxID:   [id number]

## 2010-04-08 NOTE — Letter (Signed)
Summary: Generic Letter  [facility]  [address] [place], [place] [address]   Phone: [phone]  Fax: [phone]    10/16/2009  [facility] [address] [place], [place]  [address]  To Whom it may concern,  As Mr. [facility] Primary Care Physician I recommend he not lift more than 20 pounds.           Sincerely,   Dr. Oliver Barre

## 2010-04-08 NOTE — Progress Notes (Signed)
Summary: Nuc. Pre-Procedure  Phone Note Outgoing Call Call back at [facility] Phone [phone]   Call placed by: Irean Hong, RN,  July 02, 2009 1:23 PM Summary of Call: Reviewed information on Myoview Information Sheet (see scanned document for further details).  Spoke with patient.     Nuclear Med Background Indications for Stress Test: Evaluation for Ischemia  Indications Comments: Recent ED: CP  History: Asthma, COPD, Emphysema, Heart Catheterization, Myocardial Perfusion Study  History Comments: 7/08 MPS: ? very mild inferior ischemia (low risk), EF=58%.>8/08 Cath: Normal Coronaries.  Symptoms: Chest Pain    Nuclear Pre-Procedure Cardiac Risk Factors: CVA, History of Smoking, Hypertension, Lipids, RBBB Height (in): 73

## 2010-04-08 NOTE — Progress Notes (Signed)
Summary: DISABILITY PAPER WORK  Phone Note Call from Patient Call back at [phone]   Caller: Patient Call For: Corwin Levins MD Summary of Call: Pt came into the office to dropped a disability paper work for Dr. Jonny Ruiz to complete. Pt stated that he also got a copy of an old one that Dr. Jonny Ruiz completed if he would like to use it as a sample. Gave paper work to [employer].  Initial call taken by: Livingston Diones,  April 05, 2009 4:20 PM  Follow-up for Phone Call        awaiting MD completion. Follow-up by: Lucious Groves,  April 09, 2009 9:48 AM  Additional Follow-up for Phone Call Additional follow up Details #1::        sent to healthport Additional Follow-up by: Corwin Levins MD,  April 09, 2009 12:36 PM    Additional Follow-up for Phone Call Additional follow up Details #2::    I will hold onto note until paperwork is scanned in. Follow-up by: Lucious Groves,  April 09, 2009 3:50 PM  Additional Follow-up for Phone Call Additional follow up Details #3:: Details for Additional Follow-up Action Taken: Created flag to pop up in one week to be sure paperwork gets completed. Closed phone note. Additional Follow-up by: Lucious Groves,  April 10, 2009 1:58 PM

## 2010-04-08 NOTE — Consult Note (Signed)
Summary: [facility]  [facility]   Imported By: Sherian Rein 03/21/2009 07:11:05  _____________________________________________________________________  External Attachment:    Type:   Image     Comment:   External Document

## 2010-04-08 NOTE — Letter (Signed)
Summary: EGD Instructions  [facility]  [address] [place], [place] [address]   Phone: [phone]  Fax: [phone]       Douglas Garrison    January 10, 1946    MRN: [id number]       Procedure Day Dorna Bloom: Wednesday January 19th, 2011     Arrival Time: 2:30pm     Procedure Time: 3:30pm     Location of Procedure:                    _ x _  [facility] (4th Floor)    PREPARATION FOR ENDOSCOPY   On 03/27/09  THE DAY OF THE PROCEDURE:  1.   No solid foods, milk or milk products are allowed after midnight the night before your procedure.  2.   Do not drink anything colored red or purple.  Avoid juices with pulp.  No orange juice.  3.  You may drink clear liquids until 1:30pm , which is 2 hours before your procedure.                                                                                                CLEAR LIQUIDS INCLUDE: Water Jello Ice Popsicles Tea (sugar ok, no milk/cream) Powdered fruit flavored drinks Coffee (sugar ok, no milk/cream) Gatorade Juice: apple, white grape, white cranberry  Lemonade Clear bullion, consomm, broth Carbonated beverages (any kind) Strained chicken noodle soup Hard Candy   MEDICATION INSTRUCTIONS  Unless otherwise instructed, you should take regular prescription medications with a small sip of water as early as possible the morning of your procedure.  Diabetic patients - see separate instructions.          OTHER INSTRUCTIONS  You will need a responsible adult at least 64 years of age to accompany you and drive you home.   This person must remain in the waiting room during your procedure.  Wear loose fitting clothing that is easily removed.  Leave jewelry and other valuables at home.  However, you may wish to bring a book to read or an iPod/MP3 player to listen to music as you wait for your procedure to start.  Remove all body piercing jewelry and leave at home.  Total time from sign-in until  discharge is approximately 2-3 hours.  You should go home directly after your procedure and rest.  You can resume normal activities the day after your procedure.  The day of your procedure you should not:   Drive   Make legal decisions   Operate machinery   Drink alcohol   Return to work  You will receive specific instructions about eating, activities and medications before you leave.    The above instructions have been reviewed and explained to me by   [name].     I fully understand and can verbalize these instructions _____________________________ Date _________

## 2010-04-08 NOTE — Procedures (Signed)
Summary: Upper Endoscopy w/DIL  Patient: Douglas Garrison Note: All result statuses are Final unless otherwise noted.  Tests: (1) Upper Endoscopy w/DIL (UED)  UED Upper Endoscopy w/DIL                             DONE     [facility]     [address]     [place], [place]  [address]           ENDOSCOPY PROCEDURE REPORT           PATIENT:  Douglas, Garrison  MR#:  [id number]     BIRTHDATE:  Mar 05, 1946, 63 yrs. old  GENDER:  male           ENDOSCOPIST:  Judie Petit T. Russella Dar, MD, [facility]           PROCEDURE DATE:  03/27/2009     PROCEDURE:  EGD with dilatation over guidewire     ASA CLASS:  Class II     INDICATIONS:  1) GERD  2) dysphagia  3) chest pain           MEDICATIONS:  Fentanyl 75 mcg IV, Versed 8 mg IV     TOPICAL ANESTHETIC:  Exactacain Spray           DESCRIPTION OF PROCEDURE:   After the risks benefits and     alternatives of the procedure were thoroughly explained, informed     consent was obtained.  The [id number] endoscope was     introduced through the mouth and advanced to the second portion of     the duodenum, without limitations.  The instrument was slowly     withdrawn as the mucosa was carefully examined.     <<PROCEDUREIMAGES>>           The esophagus and gastroesophageal junction were completely normal     in appearance.  The stomach was entered and closely examined. The     pylorus, antrum, angularis, and lesser curvature were well     visualized, including a retroflexed view of the cardia and fundus.     The stomach wall was normally distensable. The scope passed easily     through the pylorus into the duodenum. The duodenal bulb was     normal in appearance, as was the postbulbar duodenum.  Dilation     was then performed at the total esophagus for dysphagia without     stricture:           1) Dilator:  Savary over guidewire  Size:  17     Resistance:  none  Heme:  none           COMPLICATIONS:  None           ENDOSCOPIC IMPRESSION:     1)  Normal EGD           RECOMMENDATIONS:     1) anti-reflux regimen     2) continue PPI     3) post dilation instructions           Empress Newmann T. Russella Dar, MD, [facility]           CC:  Oliver Barre, M.D.           n.     eSIGNED:   Venita Lick. Praveen Coia at 03/27/2009 03:45 PM           Ludwig Clarks, [id number]  Note: An exclamation mark Marland Kitchen)  indicates a result that was not dispersed into the flowsheet. Document Creation Date: 03/27/2009 3:46 PM _______________________________________________________________________  (1) Order result status: Final Collection or observation date-time: 03/27/2009 15:42 Requested date-time:  Receipt date-time:  Reported date-time:  Referring Physician:   Ordering Physician: Claudette Head [phone]) Specimen Source:  Source: Launa Grill Order Number: [id number] Lab site:

## 2010-04-08 NOTE — Consult Note (Signed)
Summary: [facility]   Imported By: Lester Sampson 05/09/2009 08:34:09  _____________________________________________________________________  External Attachment:    Type:   Image     Comment:   External Document

## 2010-04-11 NOTE — Letter (Signed)
[facility]   Imported By: Sherian Rein 03/21/2009 07:03:24  _____________________________________________________________________  External Attachment:    Type:   Image     Comment:   External Document

## 2010-06-19 IMAGING — US US ABDOMEN COMPLETE
1 series · 13 of 25 positions shown · non-contrast
Comparison: None

CLINICAL DATA: Abdominal pain

ABDOMEN ULTRASOUND
TECHNIQUE: Complete abdominal ultrasound examination was performed
including evaluation of the liver, gallbladder, bile ducts,
pancreas, kidneys, spleen, IVC, and abdominal aorta.

[Series 1: us abdomen complete · 0.24mm/px · 13 of 87 slices shown]
[im 1/87]
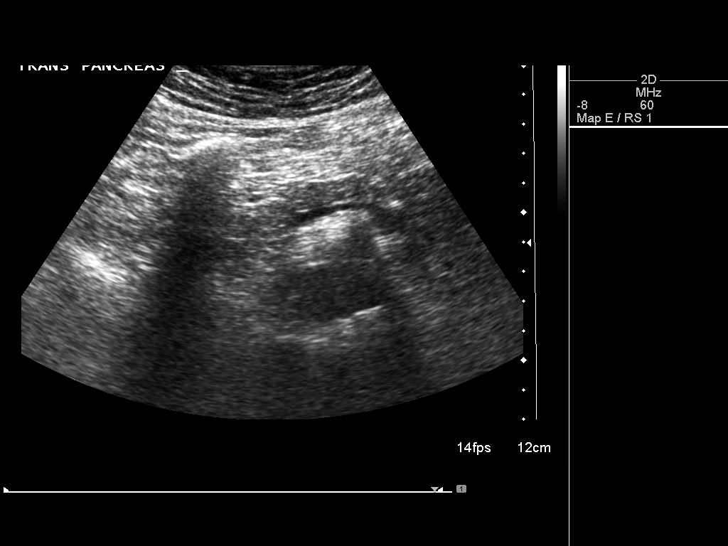
[im 8/87]
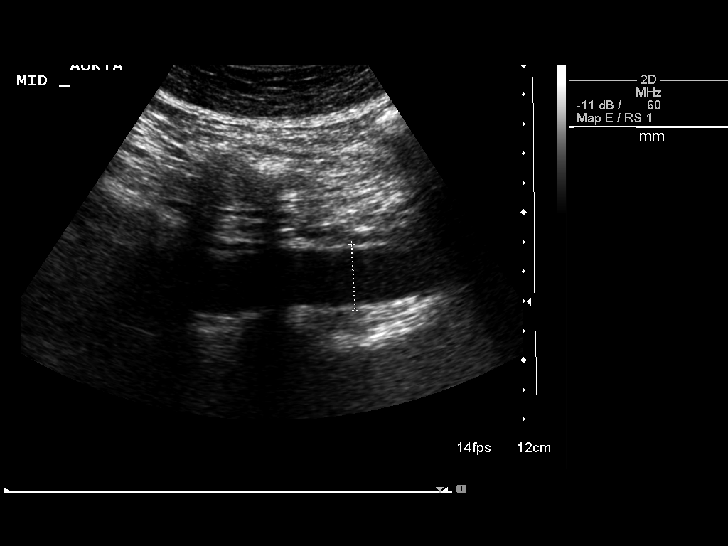
[im 15/87]
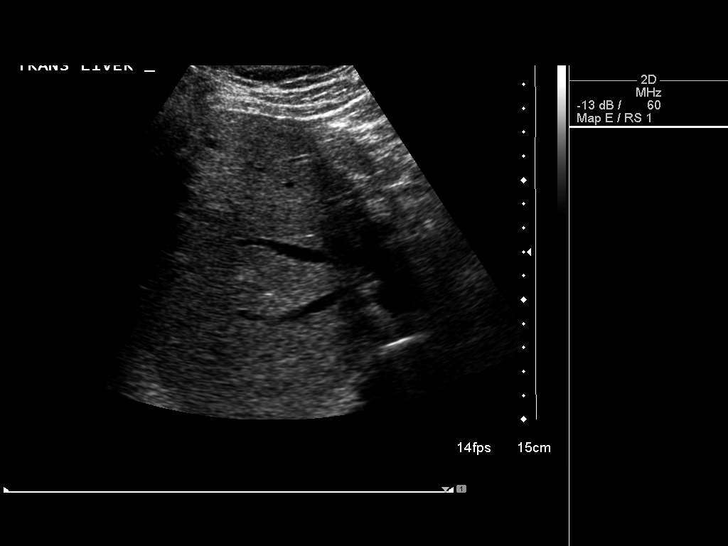
[im 22/87]
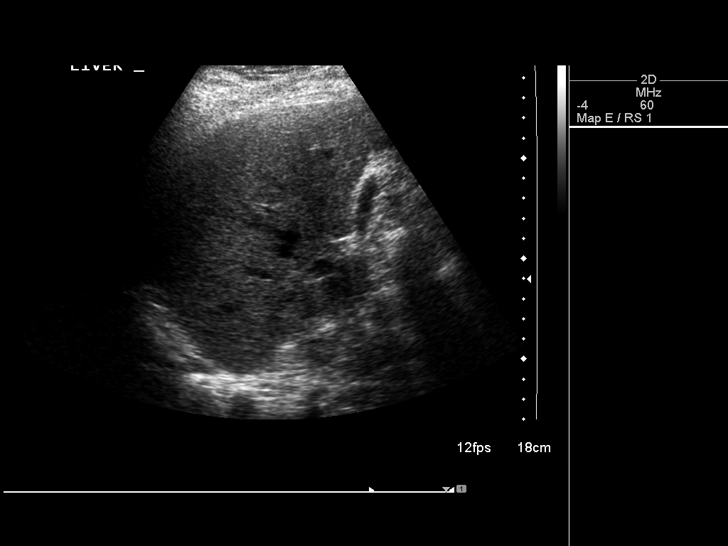
[im 29/87]
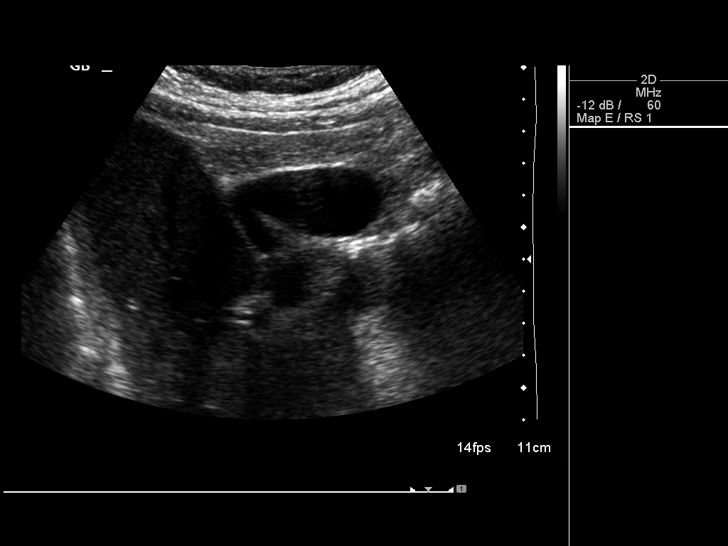
[im 36/87]
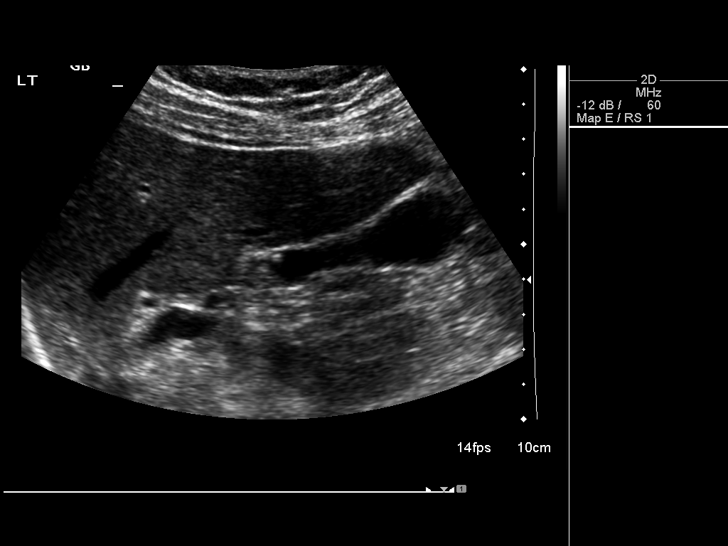
[im 44/87]
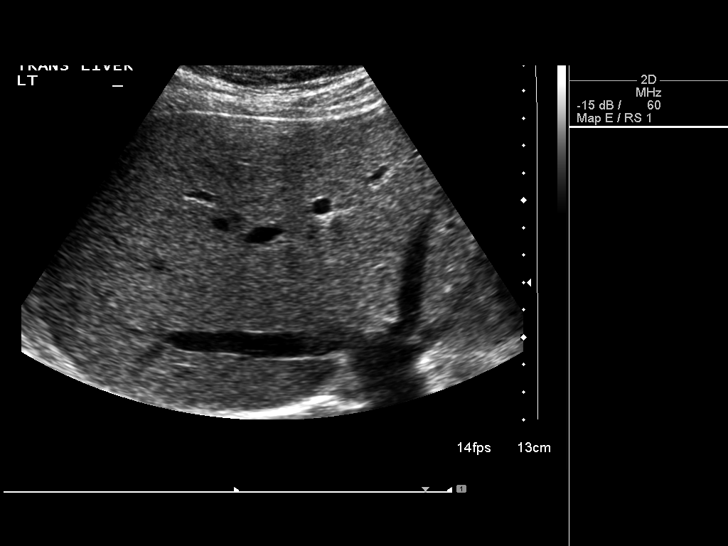
[im 51/87]
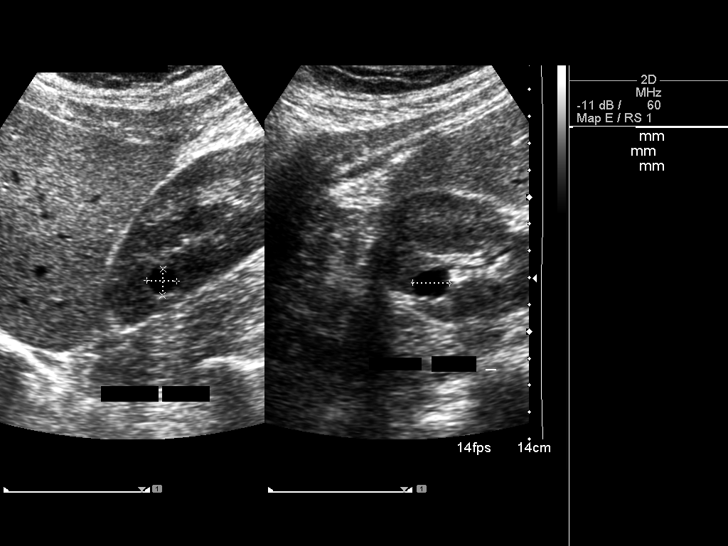
[im 58/87]
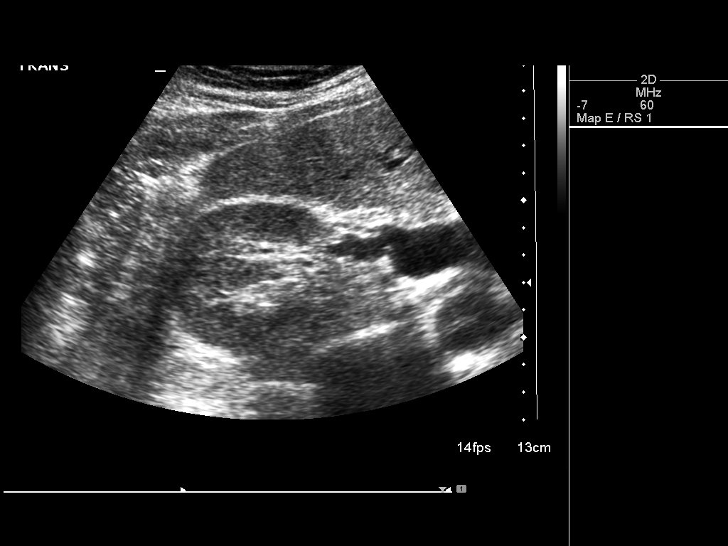
[im 65/87]
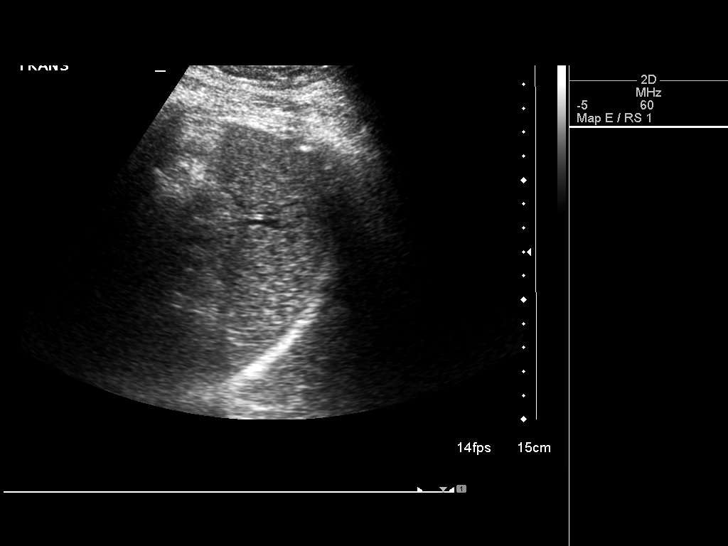
[im 72/87]
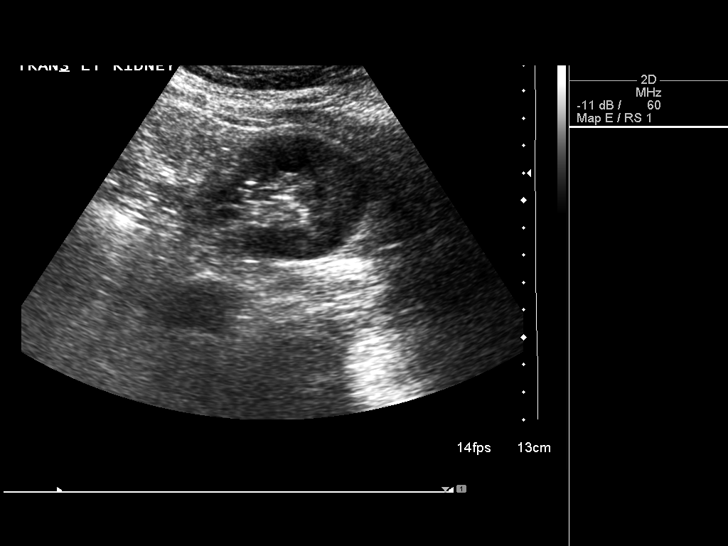
[im 79/87]
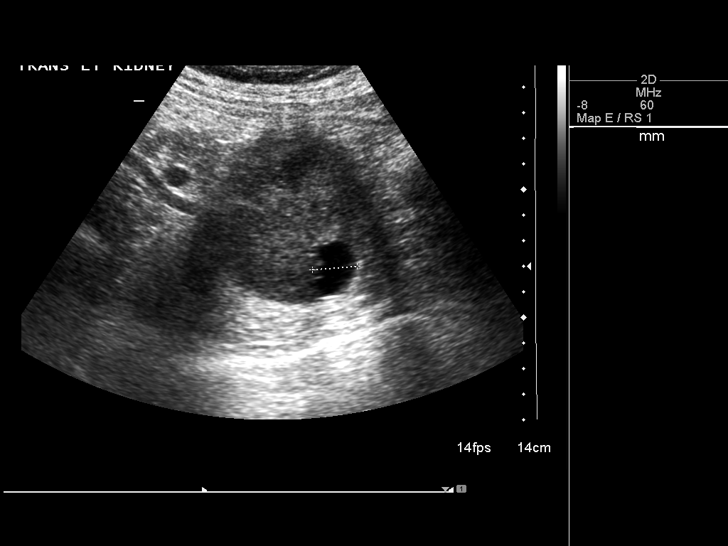
[im 87/87]
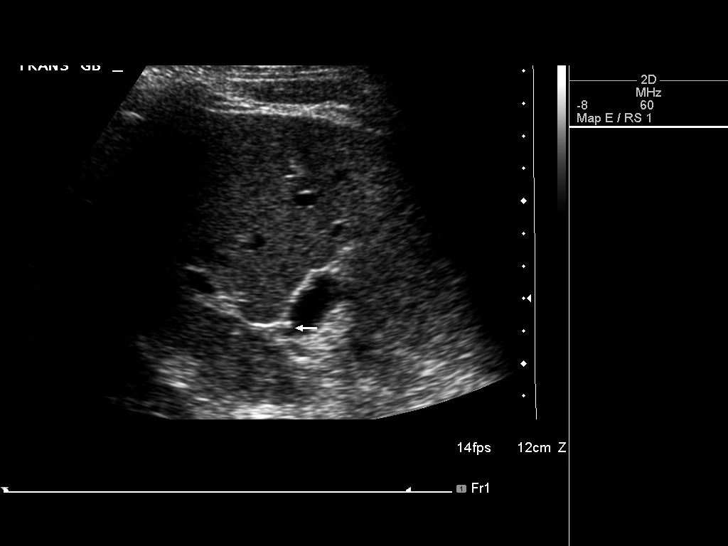

[13 of 25 positions shown; findings below may reference images not displayed]

FINDINGS: At gallbladder neck is 3 mm non mobile nonshadowing
probable cholesterol polyp.  Gallbladder is otherwise
sonographically normal without sludge or gallstones with normal
wall thickness.  No dilated intrahepatic or extrahepatic bile ducts
are seen with common bile duct measuring normally at 3 mm.  Liver,
inferior vena cava, and abdominal aorta (maximum diameter 2.8 cm)
appear sonographically normal.  Limited visualization of pancreas
noted due to overlying gas intestinal gas.  The right kidney
measures 12.0 cm long with left kidney measuring 11.3 cm long with
no hydronephrosis.  Two right renal upper pole cysts measure
and 1.4 cm respectively.  Mid to lower pole left renal 1.6 cm and
2.1 cm upper pole left renal cysts identified. Spleen is normal in
size measuring 10.5 cm long with slightly coarse echotexture yet no
focal splenic lesion noted.
IMPRESSION: 1.  3 mm probable gallbladder neck cholesterol polyp.
2.  Bilateral renal cysts.
3.  Limited visualization of pancreas.
4.  Otherwise, no significant abnormality.

## 2010-07-22 NOTE — Assessment & Plan Note (Signed)
[facility]                            CARDIOLOGY OFFICE NOTE   NAME:Hemler, GUIDO COMP                        MRN:          [id number]  DATE:10/28/2006                            DOB:          Feb 12, 1946    Mr. Muhs was seen for complete evaluation by me on October 14, 2006.  Because of risk factors and because of an episode of syncope at some  point and because of slight abnormality on a Myoview scan, a decision  was made to proceed with cardiac catheterization.  This was done on  October 19, 2006.  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  It starts adjacent to the left coronary artery.  There is no  stenosis and this is not a major problem.  There was no significant  atherosclerotic disease, and he had normal LV function.  Afterwards he  had some discomfort in his right groin.  Ultimately on the 18th he had a  Doppler evaluation to be sure that there was no obstruction or  pseudoaneurysm or fistula, and this result is normal.   He returns today and he is doing well.  He has very slight discomfort in  his right groin but otherwise he is stable.   PAST MEDICAL HISTORY:  Allergies:  DARVOCET and LATEX.   Medications:  Diovan and furosemide.   Other medical problems:  See the complete list on my note of October 14, 2006.   REVIEW OF SYSTEMS:  Today he is feeling well and he has no significant  complaints other than the HPI.   PHYSICAL EXAMINATION:  Blood pressure is 130/80 with a pulse of 74.  The patient is oriented to person, time and place.  Affect is normal.  HEENT:  No xanthelasma.  He has normal extraocular motion.  There are no carotid bruits.  There is no jugular venous distention.  LUNGS:  Clear.  Respiratory effort is not labored.  CARDIAC:  An S1 and an S2.  There are no clicks or significant murmurs.  The patient's right groin is examined.  There is very slight increase in  the width of the pulsation in the right  groin.  This will certainly  resolve with time.  There is no bruit heard.  There is no significant  peripheral edema.   Problems are listed on my note of October 14, 2006.  1. History of syncope in the remote past that may have been related to      blood pressure medicines.  No further workup is needed at this      time.  2. Mild ischemia on a Myoview study.  No significant coronary disease      in terms of atherosclerosis.  3. 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   The patient does not need further cardiac workup.     Luis Abed, MD, [facility]  Electronically Signed    JDK/MedQ  DD: 10/28/2006  DT: 10/29/2006  Job #: [id number]  cc:   Corwin Levins, MD

## 2010-07-22 NOTE — Assessment & Plan Note (Signed)
[facility]                            CARDIOLOGY OFFICE NOTE   NAME:Douglas Garrison, Douglas Garrison                        MRN:          [id number]  DATE:10/14/2006                            DOB:          [birth year]    I am asked to see the patient for cardiac evaluation by Dr.  Oliver Barre.   Mr. Seymore has had an abnormal exercise test and he is here for further  evaluation.   Mr. Deboy is 65 years old. He is active. He has low back pain and has had  some injections for this. He had had some vague chest discomfort and  some intermittent exertional shortness of breath. He also had an episode  of syncope. In the past few months. He thinks that the episode was  related to his blood pressure medications. The exact etiology is not  clear. The patient was adopted. We do not know his family history. He  quit smoking three years ago. His shortness of breath and chest pain are  vague.   PAST MEDICAL HISTORY:   ALLERGIES:  QUESTION OF ALLERGY TO LARGE DOSE OF DARVOCET AND LATEX.   MEDICATIONS:  1. He uses Celebrex on a p.r.n. basis.  2. He uses Nasonex.  3. Diovan 80.  4. Furosemide.   OTHER MEDICAL PROBLEMS:  See the list below.   SOCIAL HISTORY:  The patient is a custodian. As mentioned, he stopped  smoking three years ago.   FAMILY HISTORY:  As noted, he is adopted and we do not have family  history.   REVIEW OF SYSTEMS:  He has allergies and shortness of breath that is  related also to lung disease. He has hepatitis and there is variation in  the chart as to what type of hepatitis. The chart says hepatitis C. His  note today mentions hepatitis B. He also has arthritis. There is mention  that it may be rheumatoid arthritis. Otherwise, his review of systems is  negative.   PHYSICAL EXAMINATION:  Weight today is 214 pounds, blood pressure  131/90, with a pulse of 52. The patient is oriented to person, time and  place. Affect is normal.  HEENT: Reveals no  xanthelasma. He has normal extraocular motion. There  are no carotid bruits. There is no jugular venous distention.  LUNGS:  Are clear. Respiratory effort is not labored.  CARDIAC: Reveals an S1, with an S2. There are no clicks or significant  murmurs.  ABDOMEN: Soft. There are no masses or bruits. There are normal bowel  sounds.  He has no significant peripheral edema. There are good distal pulses.   The patient's EKG reveals right bundle branch block. A 2D echo was done  on September 17, 2006 and reveals ejection fraction of 55-60% with no marked  valvular abnormalities. The Myoview scan was done on September 17, 2006. The  patient exercised for 8 minutes and had shortness of breath and fatigue.  There were no marked EKG changes. There was some decrease activity in  the inferior wall with slight reversibility. This raised the question of  either diaphragmatic  attenuation and possibly some mild inferior  ischemia.   PROBLEM LIST:  1. History of allergic rhinitis.  2. Gastroesophageal reflux disease.  3. History of colon polyp.  4. History of hepatitis C (?B).  5. Hypertension.  6. Asthma.  7. Hyperlipidemia.  8. History of sleep apnea.  9. Chronic obstructive pulmonary disease.  10.Low back pain.  11.Arthritis (question rheumatoid arthritis).  12.Episode of syncope in the past several months that may have been      related to blood pressure medication.  13.Normal left ventricular function.  14.Mild ischemia on his Myoview study.   We do not know the patient's family history. He did smoke until three  years ago. There is an episode of syncope that is not completely  explained. The patient's chest pain and shortness of breath are not  classic, but they are concerning. I have reviewed all of these issues  with the patient. It is not clear that we have proven coronary disease.  He is in favor of being quite complete and I feel that it is appropriate  to proceed with outpatient cardiac  catheterization to assess him  further. If coronary disease is proven, we will want to proceed with  very aggressive secondary prevention. If his coronaries look good, of  course, we will recommend good primary prevention. The patient is in  agreement and we will arrange this.     Luis Abed, MD, [facility]  Electronically Signed    JDK/MedQ  DD: 10/14/2006  DT: 10/14/2006  Job #: [id number]   cc:   Corwin Levins, MD

## 2010-07-22 NOTE — Assessment & Plan Note (Signed)
[facility]                                 ON-CALL NOTE   RUBEL, HECKARD                        MRN:          [id number]  DATE:10/21/2006                            DOB:          [birth year]    PRIMARY CARDIOLOGIST:  Dr. Excell Seltzer.   Mr. Shiffler underwent catheterization approximately 2 days ago, and took  his bandage off last night.  He noted this morning that he had some  bruising at the groin site.  He denies any significant swelling, pain,  bleeding.  Further, he said the site is soft.  I advised that bruising  is fairly normal following cardiac catheterization, and may persist for  several weeks, depending upon his sensitivity to needle sticks.  He was  thankful for the call back, and he will notify us if he has any  additional groin troubles.      Nicolasa Ducking, ANP  Electronically Signed      Luis Abed, MD, [facility]  Electronically Signed   CB/MedQ  DD: 10/21/2006  DT: 10/22/2006  Job #: [id number]

## 2010-07-22 NOTE — Cardiovascular Report (Signed)
NAMEBRIYAN, KLEVEN                 ACCOUNT NO.:  [id number]   MEDICAL RECORD NO.:  [id number]          PATIENT TYPE:  OIB   LOCATION:  1962                         FACILITY:  [facility]   PHYSICIAN:  Veverly Fells. Excell Seltzer, MD  DATE OF BIRTH:  01-25-1946   DATE OF PROCEDURE:  10/19/2006  DATE OF DISCHARGE:                            CARDIAC CATHETERIZATION   PROCEDURE:  1. Left heart catheterization/  2. Selective coronary angiography.  3. Left ventricular angiography.   INDICATIONS:  Mr. Bollman is a 65 year old gentleman who has had both chest  pain and shortness of breath.  He underwent a Myoview stress study that  was suggestive of inferior wall ischemia.  He was referred for cardiac  catheterization to assess his coronary anatomy.   Risks and indications of procedure were explained to the patient.  Informed consent was obtained.  The right groin was prepped, draped,  anesthetized with 1% lidocaine. Using the modified Seldinger technique,  a 4-French sheath was placed in the right femoral artery. Multiple views  of the right and left coronary arteries were taken.  For the left  coronary artery, a standard 4-French JL-4 catheter was used.  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  I attempted to find the right coronary artery with a 4-French 3DRC, JR-  4, and AL-2.  I was unable to visualize the origin of the right coronary  artery with any of those catheters.  I then elected to advance a pigtail  into the left ventricle where left ventricular pressure was recorded.  Left ventriculogram was performed. A pullback across the aortic valve  was done, and aortic root angiogram was then performed, and it was still  difficult to visualize the right coronary artery.  A 4-French AR-1  catheter was used followed by multipurpose catheter.  At that point,  cine imaging in the right coronary cusp demonstrated the absence of the  right coronary artery.   Ultimately, the vessel was located with an AL-1  catheter, and it originated from just adjacent to the left coronary  artery.  Two views of the right coronary artery were taken.  The patient  tolerated the procedure well and had no immediate complications.   FINDINGS:  Aortic pressure 138/84 with a mean of 107. Left ventricular  pressure 137/13.   Left mainstem is angiographically normal and bifurcates into the LAD and  left circumflex.  The LAD is a large-caliber vessel that courses down  and wraps around the LV apex.  There is a large first diagonal branch  that has no significant angiographic disease.   Left circumflex is large caliber.  There is no angiographic disease.  It  provides a small intermediate branch followed by an OM-1 and OM-2  branch, both of which are medium size.   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 It originates from the  left cusp just adjacent to the left mainstem. The right coronary artery  is dominant and supplies a PDA branch.  There is no significant  angiographic disease in the right coronary artery.  Left ventriculography demonstrates normal LV function with an LVEF of  55%.  There is no mitral regurgitation.   ASSESSMENT:  1. 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  2. No significant atherosclerotic coronary artery disease.  3. Normal left ventricular function.   PLAN:  The patient will follow up with Dr. Jonny Ruiz.  He should continue  with primary risk reduction.      Veverly Fells. Excell Seltzer, MD  Electronically Signed     MDC/MEDQ  D:  10/19/2006  T:  10/20/2006  Job:  [id number]   cc:   Luis Abed, MD, [facility]  Corwin Levins, MD

## 2010-07-25 NOTE — Assessment & Plan Note (Signed)
[facility]                         GASTROENTEROLOGY OFFICE NOTE   NAME:Douglas Garrison, Douglas Garrison                        MRN:          [id number]  DATE:07/05/2006                            DOB:          01-12-1946    Mr. Douglas Garrison is a 65 year old white male whom I have seen in the past.  He  underwent colonoscopy in September 2000 with small adenomatous colon  polyps removed.  He has a history of GERD and underwent endoscopy  previously in October 2004 that showed a small hiatal hernia and no  other abnormalities.  He states he has had worsening problems with  heartburn and indigestion over the past seven years and certain  medications seemed to worsen his symptoms.  For the past six months he  has had significant morning nausea and a cramping epigastric pain.  He  relates significant gas, flatus, and belching after meals for  approximately the past year.  He has noted intermittent throat clearing.  His epigastric pain worsened while he was on anti-inflammatory  medications.  He has had occasional mild constipation.  He relates he  was diagnosed with hepatitis C about three years ago and underwent  evaluation at [facility] including a liver biopsy and no treatment  was planned due to minimal activity of his disease.   PAST MEDICAL HISTORY:  1. Hypertension.  2. Asthma.  3. Hyperlipidemia.  4. Allergic rhinitis.  5. Mild sleep apnea.  6. Arthritis.  7. Adenomatous colon polyps.  8. Gastroesophageal reflux disease.  9. Hiatal hernia.   PAST SURGICAL HISTORY:  Negative.   CURRENT MEDICATIONS:  Listed on the chart, updated, and reviewed.   ALLERGIES:  1. DARVOCET.  2. LATEX.   SOCIAL HISTORY/REVIEW OF SYSTEMS:  Per the handwritten form.   PHYSICAL EXAMINATION:  GENERAL:  No acute distress.  VITAL SIGNS:  Height 6 feet 2 inches.  Weight 217.2 pounds.  Blood  pressure 106/74.  Pulse 76 and regular.  HEENT:  Tympanic membranes clear.  Oropharynx  clear.  CHEST:  Clear to auscultation bilaterally.  CARDIAC:  Regular rate and rhythm without murmurs appreciated.  ABDOMEN:  Soft, nontender, nondistended.  Normoactive bowel sounds.  No  palpable organomegaly, masses, or hernias.  EXTREMITIES:  Without clubbing, cyanosis, or edema.  NEUROLOGIC:  Alert and oriented x3.  Grossly nonfocal.   ASSESSMENT AND PLAN:  Personal history of adenomatous colon polyps. Will  schedule colonoscopy at his return visit to see if an EGD is also  needed.  Gastroesophageal reflux disease.  Begin standard antireflux measures and  pantoprazole 40 mg p.o. every morning.  Return office visit in six weeks  to assess symptomatic response.  Consider upper endoscopy to help  exclude erosive esophagitis, gastritis, duodenitis, and ulcer disease if  his symptoms do not respond.     Venita Lick. Russella Dar, MD, [facility]  Electronically Signed    MTS/MedQ  DD: 07/06/2006  DT: 07/06/2006  Job #: [id number]

## 2010-07-25 NOTE — Procedures (Signed)
NAMEDAVED, MCFANN                 ACCOUNT NO.:  [id number]   MEDICAL RECORD NO.:  [id number]          PATIENT TYPE:  OUT   LOCATION:  SLEEP CENTER                 FACILITY:  [facility]   PHYSICIAN:  Marcelyn Bruins, M.D. [facility] DATE OF BIRTH:  1945-10-14   DATE OF STUDY:  08/30/2005                              NOCTURNAL POLYSOMNOGRAM   REFERRING PHYSICIAN:  Dr. Marcelyn Bruins   INDICATION FOR STUDY:  Hypersomnia with sleep apnea.   EPWORTH SLEEPINESS SCORE:  Is 15.   SLEEP ARCHITECTURE:  The patient had a total sleep time of 371 minutes with  mildly decreased slow wave sleep as well as REM.  Sleep onset latency was  moderately prolonged at 58 minutes, as was REM onset at 160 minutes.  Sleep  efficiency was decreased at 85%.   RESPIRATORY DATA:  The patient was found to have 32 hypopneas and 18 apneas  for a respiratory disturbance index of 8 events/hr.  The events were clearly  worse during REM and were not positional in nature.  Moderate snoring was  noted throughout.   OXYGEN DATA:  The patient had O2 desaturation as low as 88% with his  obstructive events.   CARDIAC DATA:  There were occasional episodes of AV block of the Wenckebach  type, however, there was no clinically significant cardiac arrhythmias  noted.   MOVEMENT-PARASOMNIA:  The patient was found have 80 leg jerks with very  little sleep disruption.   IMPRESSIONS/RECOMMENDATIONS:  1.  Mild obstructive sleep apnea/hypopnea syndrome with a respiratory      disturbance index of 8 events per hour and oxygen desaturation as low as      88%.  The events were clearly worse in rapid eye movement.  Treatment      for this degree of sleep apnea may include weight loss alone if      applicable, upper airway surgery, oral appliance, and also CPAP.      Clinical correlation is suggested.  2.  Moderate numbers of leg jerks with what appears to be very little sleep      disruption.           ______________________________  Marcelyn Bruins, M.D. [facility]  Diplomate, American Board of Sleep  Medicine    KC/MEDQ  D:  09/16/2005 11:41:57  T:  09/16/2005 13:03:17  Job:  [id number]

## 2010-10-18 ENCOUNTER — Other Ambulatory Visit: Payer: Self-pay | Admitting: Internal Medicine

## 2010-12-09 ENCOUNTER — Encounter: Payer: Self-pay | Admitting: Gastroenterology

## 2011-12-01 ENCOUNTER — Encounter: Payer: Self-pay | Admitting: Gastroenterology

## 2014-09-11 ENCOUNTER — Telehealth: Payer: Self-pay | Admitting: Internal Medicine

## 2014-09-11 NOTE — Telephone Encounter (Signed)
Please advise pt to contact medication records since we cannot be sure he was treated by PCP or GI

## 2014-09-11 NOTE — Telephone Encounter (Signed)
Patient stated that he was seen here about 7 years ago, and he was treated for Hep c what was the name of the medication that was prescribed. Please advise

## 2015-05-22 ENCOUNTER — Encounter: Payer: Self-pay | Admitting: Gastroenterology
